# Patient Record
Sex: Female | Born: 2000 | Race: Black or African American | Hispanic: No | Marital: Single | Smoking: Never smoker
Health system: Southern US, Community
[De-identification: ages and names within clinical notes are randomized; demographics above are authoritative.]

## PROBLEM LIST (undated history)

## (undated) DIAGNOSIS — J45909 Unspecified asthma, uncomplicated: Secondary | ICD-10-CM

## (undated) HISTORY — PX: KNEE SURGERY: SHX244

---

## 2018-10-14 ENCOUNTER — Other Ambulatory Visit: Payer: Self-pay

## 2018-10-14 ENCOUNTER — Emergency Department (HOSPITAL_COMMUNITY)
Admission: EM | Admit: 2018-10-14 | Discharge: 2018-10-15 | Disposition: A | Discharge status: Home / Self Care | Payer: Federal, State, Local not specified - PPO | Attending: Emergency Medicine | Admitting: Emergency Medicine

## 2018-10-14 DIAGNOSIS — Y939 Activity, unspecified: Secondary | ICD-10-CM | POA: Diagnosis not present

## 2018-10-14 DIAGNOSIS — X102XXA Contact with fats and cooking oils, initial encounter: Secondary | ICD-10-CM | POA: Insufficient documentation

## 2018-10-14 DIAGNOSIS — T23261A Burn of second degree of back of right hand, initial encounter: Secondary | ICD-10-CM | POA: Insufficient documentation

## 2018-10-14 DIAGNOSIS — T22211A Burn of second degree of right forearm, initial encounter: Secondary | ICD-10-CM | POA: Diagnosis not present

## 2018-10-14 DIAGNOSIS — Y999 Unspecified external cause status: Secondary | ICD-10-CM | POA: Diagnosis not present

## 2018-10-14 DIAGNOSIS — Z8709 Personal history of other diseases of the respiratory system: Secondary | ICD-10-CM | POA: Diagnosis not present

## 2018-10-14 DIAGNOSIS — T300 Burn of unspecified body region, unspecified degree: Secondary | ICD-10-CM

## 2018-10-14 DIAGNOSIS — Y929 Unspecified place or not applicable: Secondary | ICD-10-CM | POA: Diagnosis not present

## 2018-10-14 HISTORY — DX: Unspecified asthma, uncomplicated: J45.909

## 2018-10-14 NOTE — ED Triage Notes (Signed)
Pt from home w/ a c/o a burn that she sustained from grease. She reports putting water on a hot, greased pan that splashed back on her. Minor, 1st degree burns with some blistering extends from her right hand to the medial side of her upper arm. The injury occurred at ~2145.

## 2018-10-15 ENCOUNTER — Encounter (HOSPITAL_COMMUNITY): Payer: Self-pay

## 2018-10-15 MED ORDER — SILVER SULFADIAZINE 1 % EX CREA
TOPICAL_CREAM | Freq: Once | CUTANEOUS | Status: AC
  Administered 2018-10-15: 1 via TOPICAL
  Filled 2018-10-15: qty 85

## 2018-10-15 NOTE — ED Notes (Signed)
Patient verbalizes understanding of discharge instructions. Opportunity for questioning and answers were provided. Armband removed by staff, pt discharged from ED.  

## 2018-10-15 NOTE — ED Provider Notes (Signed)
[facility]   CSN: [id number] Arrival date & time: 10/14/18  2347    History   Chief Complaint Chief Complaint  Patient presents with  . Burn    HPI Kimberly Ingram is a 18 y.o. female.     The history is provided by the patient and medical records.     18 y.o. F with hx of asthma, presenting to the ED for burn of right hand/arm.  States she poured some water into a pan with hot grease in it and it splashed back onto her.  States this actually occurred a few hours ago.  She did run cold water over it but skin still hurts and burns.  Tetanus UTD.    Past Medical History:  Diagnosis Date  . Asthma     There are no active problems to display for this patient.   History reviewed. No pertinent surgical history.   OB History   No obstetric history on file.      Home Medications    Prior to Admission medications   Not on File    Family History History reviewed. No pertinent family history.  Social History Social History   Tobacco Use  . Smoking status: Never Smoker  . Smokeless tobacco: Never Used  Substance Use Topics  . Alcohol use: Never    Frequency: Never  . Drug use: Not Currently    Types: Marijuana     Allergies   Patient has no allergy information on record.   Review of Systems Review of Systems  Skin: Positive for wound.  All other systems reviewed and are negative.    Physical Exam Updated Vital Signs BP (!) 99/54   Pulse 71   Temp 98 F (36.7 C) (Oral)   Ht 5\' 7"  (1.702 m)   Wt 68 kg   LMP 08/29/2018   SpO2 99%   BMI 23.49 kg/m   Physical Exam Vitals signs and nursing note reviewed.  Constitutional:      Appearance: She is well-developed.  HENT:     Head: Normocephalic and atraumatic.  Eyes:     Conjunctiva/sclera: Conjunctivae normal.     Pupils: Pupils are equal, round, and reactive to light.  Neck:     Musculoskeletal: Normal range of motion.  Cardiovascular:     Rate and Rhythm: Normal rate and regular rhythm.     Heart sounds: Normal heart sounds.  Pulmonary:     Effort: Pulmonary effort is normal.     Breath sounds: Normal breath sounds.  Abdominal:     General: Bowel sounds are normal.     Palpations: Abdomen is soft.  Musculoskeletal: Normal range of motion.     Comments: Right hand/arm with mixed areas of first and second degree burns; largest area overlying the right first MCP joint, small amount of blistering noted but able to flex/extend joint without issue; no palmar involvement; there are several other splotchy areas of first degree burns, 2 areas on forearm have small blisters noted; no drainage or signs of superimposed infection TBSA involved <2%  Skin:    General: Skin is warm and dry.  Neurological:     Mental Status: She is alert and oriented to person, place, and time.        ED Treatments / Results  Labs (all labs ordered are listed, but only abnormal results are displayed) Labs Reviewed - No data to display  EKG None  Radiology No results found.  Procedures  Procedures (including critical care time)  Medications Ordered in ED Medications - No data to display   Initial Impression / Assessment and Plan / ED Course  I have reviewed the triage vital signs and the nursing notes.  Pertinent labs & imaging results that were available during my care of the patient were reviewed by me and considered in my medical decision making (see chart for details).  18 y.o. F here with burn of right dorsal hand/forearm that occurred a few hours PTA after pouring water into pan with hot grease, states it splashed back on her.  She is afebrile, non-toxic.  She has splotchy appearing wounds to right dorsal hand/forearm, most of which are first degree but few small areas of blistering consistent with second degree.  Largest area does extend over left first MCP joint but no noted contractures.  No palmar involvement.  Able to flex/extend  all areas without difficulty.  Arm is NVI.  Tetanus UTD.  Given silvadene cream and dressing applied, continue home wounds care.  Can follow-up with PCP.  Return here for any new/acute changes.  Final Clinical Impressions(s) / ED Diagnoses   Final diagnoses:  Burn    ED Discharge Orders    None       Garlon Hatchet, PA-C 10/15/18 0043    Nira Conn, MD 10/16/18 [phone]

## 2018-10-15 NOTE — Discharge Instructions (Addendum)
Continue using silvadene to affected areas twice daily.  Recommend to keep covered if working, cooking, cleaning, etc. Keep clean with gentle soap and warm water.  Blistered areas may pop and drain some clear fluid, DO NOT pop them yourself or stick anything in them (needle, safety pin, etc)  as this will increase risk of infection. Follow-up with your primary care doctor. Return here for any new/acute changes.

## 2019-08-15 ENCOUNTER — Ambulatory Visit: Payer: Federal, State, Local not specified - PPO | Attending: Family

## 2019-08-15 DIAGNOSIS — Z23 Encounter for immunization: Secondary | ICD-10-CM

## 2019-08-15 NOTE — Progress Notes (Signed)
   Covid-19 Vaccination Clinic  Name:  Kandra Graven    MRN: [id number] DOB: August 30, 2000  08/15/2019  Ms. Schaafsma was observed post Covid-19 immunization for 15 minutes without incident. She was provided with Vaccine Information Sheet and instruction to access the V-Safe system.   Ms. Neuzil was instructed to call 911 with any severe reactions post vaccine: Marland Kitchen Difficulty breathing  . Swelling of face and throat  . A fast heartbeat  . A bad rash all over body  . Dizziness and weakness   Immunizations Administered    Name Date Dose VIS Date Route   Moderna COVID-19 Vaccine 08/15/2019 10:17 AM 0.5 mL 04/30/2019 Intramuscular   Manufacturer: Moderna   Lot: [id number]   NDC: 33832-919-16

## 2019-09-17 ENCOUNTER — Ambulatory Visit: Payer: Federal, State, Local not specified - PPO | Attending: Family

## 2019-09-17 ENCOUNTER — Ambulatory Visit: Payer: Federal, State, Local not specified - PPO

## 2019-09-17 DIAGNOSIS — Z23 Encounter for immunization: Secondary | ICD-10-CM

## 2019-09-17 NOTE — Progress Notes (Signed)
   Covid-19 Vaccination Clinic  Name:  [name]    MRN: [id number] DOB: 13-Apr-2001  09/17/2019  [name] was observed post Covid-19 immunization for 15 minutes without incident. She was provided with Vaccine Information Sheet and instruction to access the V-Safe system.   [name] was instructed to call 911 with any severe reactions post vaccine: Marland Kitchen Difficulty breathing  . Swelling of face and throat  . A fast heartbeat  . A bad rash all over body  . Dizziness and weakness   Immunizations Administered    Name Date Dose VIS Date Route   Moderna COVID-19 Vaccine 09/17/2019  2:40 PM 0.5 mL 04/2019 Intramuscular   Manufacturer: Moderna   Lot: [id number]   NDC: 25483-234-68

## 2020-03-27 ENCOUNTER — Emergency Department (HOSPITAL_COMMUNITY)
Admission: EM | Admit: 2020-03-27 | Discharge: 2020-03-28 | Disposition: A | Discharge status: Home / Self Care | Payer: Federal, State, Local not specified - PPO | Source: Home / Self Care | Attending: Emergency Medicine | Admitting: Emergency Medicine

## 2020-03-27 ENCOUNTER — Encounter (HOSPITAL_COMMUNITY): Payer: Self-pay | Admitting: Emergency Medicine

## 2020-03-27 ENCOUNTER — Emergency Department (HOSPITAL_COMMUNITY): Payer: Federal, State, Local not specified - PPO

## 2020-03-27 DIAGNOSIS — J45909 Unspecified asthma, uncomplicated: Secondary | ICD-10-CM | POA: Insufficient documentation

## 2020-03-27 DIAGNOSIS — F419 Anxiety disorder, unspecified: Secondary | ICD-10-CM | POA: Insufficient documentation

## 2020-03-27 DIAGNOSIS — F1290 Cannabis use, unspecified, uncomplicated: Secondary | ICD-10-CM | POA: Insufficient documentation

## 2020-03-27 DIAGNOSIS — R519 Headache, unspecified: Secondary | ICD-10-CM | POA: Insufficient documentation

## 2020-03-27 DIAGNOSIS — F332 Major depressive disorder, recurrent severe without psychotic features: Secondary | ICD-10-CM | POA: Insufficient documentation

## 2020-03-27 DIAGNOSIS — Z20822 Contact with and (suspected) exposure to covid-19: Secondary | ICD-10-CM | POA: Insufficient documentation

## 2020-03-27 DIAGNOSIS — R45851 Suicidal ideations: Secondary | ICD-10-CM | POA: Insufficient documentation

## 2020-03-27 DIAGNOSIS — H53149 Visual discomfort, unspecified: Secondary | ICD-10-CM | POA: Insufficient documentation

## 2020-03-27 LAB — RAPID URINE DRUG SCREEN, HOSP PERFORMED
Amphetamines: NOT DETECTED
Barbiturates: NOT DETECTED
Benzodiazepines: NOT DETECTED
Cocaine: NOT DETECTED
Opiates: NOT DETECTED
Tetrahydrocannabinol: POSITIVE — AB

## 2020-03-27 LAB — COMPREHENSIVE METABOLIC PANEL
ALT: 25 U/L (ref 0–44)
AST: 19 U/L (ref 15–41)
Albumin: 4.1 g/dL (ref 3.5–5.0)
Alkaline Phosphatase: 65 U/L (ref 38–126)
Anion gap: 9 (ref 5–15)
BUN: 8 mg/dL (ref 6–20)
CO2: 27 mmol/L (ref 22–32)
Calcium: 9 mg/dL (ref 8.9–10.3)
Chloride: 102 mmol/L (ref 98–111)
Creatinine, Ser: 0.85 mg/dL (ref 0.44–1.00)
GFR, Estimated: >60 mL/min (ref >60)
Glucose, Bld: 96 mg/dL (ref 70–99)
Potassium: 3.9 mmol/L (ref 3.5–5.1)
Sodium: 138 mmol/L (ref 135–145)
Total Bilirubin: 1 mg/dL (ref 0.3–1.2)
Total Protein: 8 g/dL (ref 6.5–8.1)

## 2020-03-27 LAB — CBC
HCT: 37.9 % (ref 36.0–46.0)
Hemoglobin: 12.4 g/dL (ref 12.0–15.0)
MCH: 30.3 pg (ref 26.0–34.0)
MCHC: 32.7 g/dL (ref 30.0–36.0)
MCV: 92.7 fL (ref 80.0–100.0)
Platelets: 250 10*3/uL (ref 150–400)
RBC: 4.09 MIL/uL (ref 3.87–5.11)
RDW: 13.3 % (ref 11.5–15.5)
WBC: 4.1 10*3/uL (ref 4.0–10.5)
nRBC: 0 % (ref 0.0–0.2)

## 2020-03-27 LAB — ACETAMINOPHEN LEVEL: Acetaminophen (Tylenol), Serum: <10 ug/mL — ABNORMAL LOW (ref 10–30)

## 2020-03-27 LAB — ETHANOL: Alcohol, Ethyl (B): <10 mg/dL (ref <10)

## 2020-03-27 LAB — SALICYLATE LEVEL: Salicylate Lvl: <7 mg/dL — ABNORMAL LOW (ref 7.0–30.0)

## 2020-03-27 LAB — RESPIRATORY PANEL BY RT PCR (FLU A&B, COVID)
Influenza A by PCR: NEGATIVE
Influenza B by PCR: NEGATIVE
SARS Coronavirus 2 by RT PCR: NEGATIVE

## 2020-03-27 LAB — I-STAT BETA HCG BLOOD, ED (MC, WL, AP ONLY): I-stat hCG, quantitative: <5 m[IU]/mL (ref <5)

## 2020-03-27 MED ORDER — RISPERIDONE 1 MG PO TBDP: 2.0000 mg | ORAL_TABLET | Freq: Three times a day (TID) | ORAL | Status: DC | PRN

## 2020-03-27 MED ORDER — LORAZEPAM 1 MG PO TABS: 1.0000 mg | ORAL_TABLET | ORAL | Status: DC | PRN

## 2020-03-27 MED ORDER — ZIPRASIDONE MESYLATE 20 MG IM SOLR: 20.0000 mg | INTRAMUSCULAR | Status: DC | PRN

## 2020-03-27 MED ORDER — ALUM & MAG HYDROXIDE-SIMETH 200-200-20 MG/5ML PO SUSP: 30.0000 mL | Freq: Four times a day (QID) | ORAL | Status: DC | PRN

## 2020-03-27 MED ORDER — ONDANSETRON HCL 4 MG PO TABS: 4.0000 mg | ORAL_TABLET | Freq: Three times a day (TID) | ORAL | Status: DC | PRN

## 2020-03-27 MED ORDER — ACETAMINOPHEN 325 MG PO TABS: 650.0000 mg | ORAL_TABLET | ORAL | Status: DC | PRN

## 2020-03-27 MED ORDER — ZOLPIDEM TARTRATE 5 MG PO TABS: 5.0000 mg | ORAL_TABLET | Freq: Every evening | ORAL | Status: DC | PRN

## 2020-03-27 MED ORDER — NICOTINE 21 MG/24HR TD PT24: 21.0000 mg | MEDICATED_PATCH | Freq: Every day | TRANSDERMAL | Status: DC

## 2020-03-27 NOTE — ED Notes (Signed)
Pt transported to [facility].  Belongings put in nurses station cabinet in the 9-12 and 23-25 section.

## 2020-03-27 NOTE — ED Provider Notes (Signed)
[facility]-EMERGENCY DEPT Provider Note   CSN: [id number] Arrival date & time: 03/27/20  1701     History Chief Complaint  Patient presents with  . Suicidal    Kimberly Ingram is a 19 y.o. female.  The history is provided by the patient and the EMS personnel. No language interpreter was used.     19 year old female brought here via EMS accompanied by [employer] for suicidal ideation.  Per EMS, patient's father found her passed out in her bedroom floor and patient cannot recall what has happened in the past few hours.  And there was a plan to IVC the patient.  Patient admits that she is feeling very depressed with suicidal ideation but did not disclose any specific plan.  She reports she cannot remember what has happened but does complain of throbbing headache and light sensitivity.  She denies homicidal ideation auditory or visual hallucination.  She admits to eating sleeping last.  She report occasional marijuana use but denies alcohol or tobacco use or any other drug use.  Last menstruation approximate 2 weeks ago.  Patient works at a bank.  She is currently in school.  She reported stress test due to multifactorial and everything stresses.  When asked if she may have intentionally took anything to harm herself patient states she does not remember  Past Medical History:  Diagnosis Date  . Asthma     There are no problems to display for this patient.   Past Surgical History:  Procedure Laterality Date  . KNEE SURGERY       OB History   No obstetric history on file.     History reviewed. No pertinent family history.  Social History   Tobacco Use  . Smoking status: Never Smoker  . Smokeless tobacco: Never Used  Vaping Use  . Vaping Use: Never used  Substance Use Topics  . Alcohol use: Never  . Drug use: Not Currently    Types: Marijuana    Home Medications Prior to Admission medications   Not on File    Allergies    Patient has no known  allergies.  Review of Systems   Review of Systems  Unable to perform ROS: Psychiatric disorder  All other systems reviewed and are negative.   Physical Exam Updated Vital Signs BP 125/79 (BP Location: Left Arm)   Pulse 63   Temp 98.9 F (37.2 C) (Oral)   Resp 14   Ht 5\' 6"  (1.676 m)   Wt 63.5 kg   SpO2 99%   BMI 22.60 kg/m   Physical Exam Vitals and nursing note reviewed.  Constitutional:      General: She is not in acute distress.    Appearance: She is well-developed.     Comments: Patient sitting in the chair, cover her head with a blanket and with her eyes closed.  HENT:     Head: Normocephalic and atraumatic.  Eyes:     Extraocular Movements: Extraocular movements intact.     Conjunctiva/sclera: Conjunctivae normal.     Pupils: Pupils are equal, round, and reactive to light.  Cardiovascular:     Rate and Rhythm: Normal rate and regular rhythm.     Pulses: Normal pulses.     Heart sounds: Normal heart sounds.  Pulmonary:     Effort: Pulmonary effort is normal.     Breath sounds: Normal breath sounds.  Abdominal:     Palpations: Abdomen is soft.     Tenderness: There is no abdominal  tenderness.  Musculoskeletal:     Cervical back: Normal range of motion and neck supple. No rigidity.  Skin:    Findings: No rash.  Neurological:     Mental Status: She is alert.     GCS: GCS eye subscore is 4. GCS verbal subscore is 5. GCS motor subscore is 6.     Sensory: Sensation is intact.     Motor: Motor function is intact.  Psychiatric:        Mood and Affect: Mood is depressed. Affect is blunt.        Speech: Speech is delayed.        Behavior: Behavior is withdrawn. Behavior is cooperative.        Thought Content: Thought content is not paranoid. Thought content includes suicidal ideation. Thought content does not include homicidal ideation.     ED Results / Procedures / Treatments   Labs (all labs ordered are listed, but only abnormal results are displayed) Labs  Reviewed  SALICYLATE LEVEL - Abnormal; Notable for the following components:      Result Value   Salicylate Lvl <7.0 (*)    All other components within normal limits  ACETAMINOPHEN LEVEL - Abnormal; Notable for the following components:   Acetaminophen (Tylenol), Serum <10 (*)    All other components within normal limits  RAPID URINE DRUG SCREEN, HOSP PERFORMED - Abnormal; Notable for the following components:   Tetrahydrocannabinol POSITIVE (*)    All other components within normal limits  RESPIRATORY PANEL BY RT PCR (FLU A&B, COVID)  COMPREHENSIVE METABOLIC PANEL  ETHANOL  CBC  I-STAT BETA HCG BLOOD, ED ([facility], [facility], [facility] ONLY)    EKG None  Radiology CT Head Wo Contrast  Result Date: 03/27/2020 CLINICAL DATA:  Headache, syncope. EXAM: CT HEAD WITHOUT CONTRAST TECHNIQUE: Contiguous axial images were obtained from the base of the skull through the vertex without intravenous contrast. COMPARISON:  None. FINDINGS: Brain: No acute infarct or intracranial hemorrhage. No mass lesion. No midline shift, ventriculomegaly or extra-axial fluid collection. Vascular: No hyperdense vessel or unexpected calcification. Skull: Negative for fracture or focal lesion. Sinuses/Orbits: Normal orbits. Clear paranasal sinuses. No mastoid effusion. Other: None. IMPRESSION: No acute intracranial process. Electronically Signed   By: Stana Bunting M.D.   On: 03/27/2020 18:17    Procedures Procedures (including critical care time)  Medications Ordered in ED Medications  risperiDONE (RISPERDAL M-TABS) disintegrating tablet 2 mg (has no administration in time range)    And  LORazepam (ATIVAN) tablet 1 mg (has no administration in time range)    And  ziprasidone (GEODON) injection 20 mg (has no administration in time range)  acetaminophen (TYLENOL) tablet 650 mg (has no administration in time range)  zolpidem (AMBIEN) tablet 5 mg (has no administration in time range)  ondansetron (ZOFRAN) tablet 4 mg (has no  administration in time range)  alum & mag hydroxide-simeth (MAALOX/MYLANTA) 200-200-20 MG/5ML suspension 30 mL (has no administration in time range)  nicotine (NICODERM CQ - dosed in mg/24 hours) patch 21 mg (has no administration in time range)    ED Course  I have reviewed the triage vital signs and the nursing notes.  Pertinent labs & imaging results that were available during my care of the patient were reviewed by me and considered in my medical decision making (see chart for details).    MDM Rules/Calculators/A&P  BP 125/79 (BP Location: Left Arm)   Pulse 63   Temp 98.9 F (37.2 C) (Oral)   Resp 14   Ht 5\' 6"  (1.676 m)   Wt 63.5 kg   SpO2 99%   BMI 22.60 kg/m   Final Clinical Impression(s) / ED Diagnoses Final diagnoses:  Suicidal ideation    Rx / DC Orders ED Discharge Orders    None     5:39 PM Patient brought here with concerns for suicidal attempt.  She has been feeling depressed for an unknown amount of time.  Unsure if patient may have intentionally overdosed on anything as she does not disclose much information.  She is currently hemodynamically stable.  She does endorse some headache and she was found on the ground therefore will obtain head CT scan.  Will filed IVC and first exam paper.  10:54 PM Pt is medically cleared and can be assessed and managed further by TTS and psychiatry.    , PA-C 03/27/20 2255    03/29/20, MD 03/27/20 2308

## 2020-03-27 NOTE — ED Triage Notes (Signed)
Patient presents with suicidal ideations. Her father found her passed out on her bedroom floor. She does not remember anything form the last 4 hours. There is a plan to IVC the patient. She also complains of a headache and is light sensitive. Patient takes Flexin and Albuterol at home.   EMS vitals: 122/70 BP 68 HR 98% SPO2 on room air

## 2020-03-27 NOTE — ED Notes (Addendum)
Pt changed into burgandy scrubs and belongings collected.  Belongings include sweatshirt, shorts and t shirt with tennis shoes. There is a biohazard bag with earrings and a gold cross necklace included. Belongings stored in triage nurse station cabinet.  Pt wanded by security.

## 2020-03-27 NOTE — ED Notes (Signed)
Patient believes she may have hit her head recently, but is not sure. Patient is also not forthcoming with information. Ie, When asked if she wanted to harm herself, her response was if I say yes will they keep me here. When the RN advised the patient to answer the questions truthfully, she admitted to having suicidal ideations.

## 2020-03-28 ENCOUNTER — Inpatient Hospital Stay (HOSPITAL_COMMUNITY)
Admission: AD | Admit: 2020-03-28 | Discharge: 2020-03-30 | DRG: 885 | Disposition: A | Discharge status: Home / Self Care | Payer: Federal, State, Local not specified - PPO | Source: Intra-hospital | Attending: Psychiatry | Admitting: Psychiatry

## 2020-03-28 ENCOUNTER — Encounter: Payer: Self-pay | Admitting: Behavioral Health

## 2020-03-28 ENCOUNTER — Encounter (HOSPITAL_COMMUNITY): Payer: Self-pay | Admitting: Psychiatry

## 2020-03-28 ENCOUNTER — Other Ambulatory Visit: Payer: Self-pay

## 2020-03-28 DIAGNOSIS — F410 Panic disorder [episodic paroxysmal anxiety] without agoraphobia: Secondary | ICD-10-CM | POA: Diagnosis present

## 2020-03-28 DIAGNOSIS — F419 Anxiety disorder, unspecified: Secondary | ICD-10-CM | POA: Diagnosis present

## 2020-03-28 DIAGNOSIS — F1220 Cannabis dependence, uncomplicated: Secondary | ICD-10-CM | POA: Diagnosis present

## 2020-03-28 DIAGNOSIS — R45851 Suicidal ideations: Secondary | ICD-10-CM | POA: Diagnosis present

## 2020-03-28 DIAGNOSIS — Z91010 Allergy to peanuts: Secondary | ICD-10-CM

## 2020-03-28 DIAGNOSIS — F332 Major depressive disorder, recurrent severe without psychotic features: Principal | ICD-10-CM | POA: Diagnosis present

## 2020-03-28 DIAGNOSIS — J45909 Unspecified asthma, uncomplicated: Secondary | ICD-10-CM | POA: Diagnosis present

## 2020-03-28 DIAGNOSIS — F1210 Cannabis abuse, uncomplicated: Secondary | ICD-10-CM | POA: Diagnosis not present

## 2020-03-28 MED ORDER — ALUM & MAG HYDROXIDE-SIMETH 200-200-20 MG/5ML PO SUSP: 30.0000 mL | ORAL | Status: DC | PRN

## 2020-03-28 MED ORDER — ACETAMINOPHEN 325 MG PO TABS: 650.0000 mg | ORAL_TABLET | Freq: Four times a day (QID) | ORAL | Status: DC | PRN

## 2020-03-28 MED ORDER — MAGNESIUM HYDROXIDE 400 MG/5ML PO SUSP: 30.0000 mL | Freq: Every day | ORAL | Status: DC | PRN

## 2020-03-28 NOTE — Progress Notes (Signed)
   03/28/20 2030  Psych Admission Type (Psych Patients Only)  Admission Status Involuntary  Psychosocial Assessment  Patient Complaints None  Eye Contact Fair  Facial Expression Other (Comment) (appropriate)  Affect Appropriate to circumstance  Speech Logical/coherent  Interaction Assertive  Motor Activity Other (Comment) (wnl)  Appearance/Hygiene Unremarkable  Behavior Characteristics Appropriate to situation  Mood Pleasant  Thought Process  Coherency WDL  Content WDL  Delusions None reported or observed  Perception WDL  Hallucination None reported or observed  Judgment Poor  Confusion None  Danger to Self  Current suicidal ideation? Denies  Danger to Others  Danger to Others None reported or observed   Pt seen interacting in dayroom. Pt denies SI, HI, AVH and pain. Pt is pleasant. Hopes to be discharged tomorrow.

## 2020-03-28 NOTE — [facility] Suicide Risk Assessment (Addendum)
[facility] INPATIENT:  Family/Significant Other Suicide Prevention Education  Suicide Prevention Education:  Contact Attempts: friend, Joselyn Glassman [phone] has been identified by the patient as the family member/significant other with whom the patient will be residing, and identified as the person(s) who will aid the patient in the event of a mental health crisis.  With written consent from the patient, two attempts were made to provide suicide prevention education, prior to and/or following the patient's discharge.  We were unsuccessful in providing suicide prevention education.  A suicide education pamphlet was given to the patient to share with family/significant other.  Date and time of first attempt: 03/28/2020 at 4:35pm Date and time of second attempt: 03/29/2020 at 2:52pm   SPE pamphlet placed on chart for patient to share with supports at discharge.  Darreld Mclean 03/28/2020, 4:37 PM   Fredirick Lathe, LCSWA Clinicial Social Worker [facility]

## 2020-03-28 NOTE — [facility] Group Notes (Signed)
Psychoeducational Group Note  Date: 03-28-20 Time:  8099-8338  Group Topic/Focus:  Identifying Needs:   The focus of this group is to help patients identify their personal needs that have been historically problematic and identify healthy behaviors to address their needs.  Participation Level:  Did Not Attend   Dione Housekeeper

## 2020-03-28 NOTE — Progress Notes (Signed)
[place] NOVEL CORONAVIRUS (COVID-19) DAILY CHECK-OFF SYMPTOMS - answer yes or no to each - every day NO YES  Have you had a fever in the past 24 hours?  . Fever (Temp > 37.80C / 100F) X   Have you had any of these symptoms in the past 24 hours? . New Cough .  Sore Throat  .  Shortness of Breath .  Difficulty Breathing .  Unexplained Body Aches   X   Have you had any one of these symptoms in the past 24 hours not related to allergies?   . Runny Nose .  Nasal Congestion .  Sneezing   X   If you have had runny nose, nasal congestion, sneezing in the past 24 hours, has it worsened?  X   EXPOSURES - check yes or no X   Have you traveled outside the state in the past 14 days?  X   Have you been in contact with someone with a confirmed diagnosis of COVID-19 or PUI in the past 14 days without wearing appropriate PPE?  X   Have you been living in the same home as a person with confirmed diagnosis of COVID-19 or a PUI (household contact)?    X   Have you been diagnosed with COVID-19?    X              What to do next: Answered NO to all: Answered YES to anything:   Proceed with unit schedule Follow the [facility] Inpatient Flowsheet.   Pt visible at medication window on initial approach. Presents superficial on interactions with congruent affect, logical, clear speech, fair eye contact and ambulatory with a steady gait. Rates her depression 0/10 and anxiety 5/10 when assessed. Emotional support and availability provided to pt this shift. Writer encouraged pt to voice concerns.  Pt receptive to care. Cooperative with unit routines.  Observed in scheduled groups. Tolerates all PO intake well. Safety maintained on and off unit.

## 2020-03-28 NOTE — [facility] Suicide Risk Assessment (Signed)
[facility] INPATIENT:  Family/Significant Other Suicide Prevention Education  Suicide Prevention Education:  Patient unable to provide consent for Family/Significant Other Suicide Prevention Education: The patient has been unable to provide written consent for family/significant other to be provided Family/Significant Other Suicide Prevention Education during admission and/or prior to discharge. Physician notified.  SPE completed with patient, as patient has been unable to provide consent for family contact. SPE pamphlet placed on chart for patient to share with supports at discharge.  Arnold Depinto, LCSWA Clinicial Social Worker [facility] 

## 2020-03-28 NOTE — H&P (Signed)
Psychiatric Admission Assessment Adult  Patient Identification: Kimberly Ingram MRN:  [id number] Date of Evaluation:  03/28/2020 Chief Complaint:  MDD (major depressive disorder), recurrent episode, severe (HCC) [F33.2] Principal Diagnosis: MDD (major depressive disorder), recurrent episode, severe (HCC) Diagnosis:  Principal Problem:   MDD (major depressive disorder), recurrent episode, severe (HCC) Active Problems:   Marijuana abuse   Anxious mood  History of Present Illness:   Per ED provider assessment: 19 yr old female brought here via EMS accompanied by [employer] for suicidal ideation.  Per EMS, patient's father found her passed out in her bedroom floor and patient cannot recall what has happened in the past few hours.  And there was a plan to IVC the patient.  Patient admits that she is feeling very depressed with suicidal ideation but did not disclose any specific plan.  Per TTS Assessment note:  Pt reports that her parents are in [place], which is where she is from.  Patient says she cannot recall who found her passed out and that she cannot recall even the EMS ride to [facility]. Patient says that right now she is not feeling suicidal.  Earlier yesterday she did feel suicidal but with no plan or intention.  She denies any previous attempts to end her life.  Patient says she has been very depressed and anxious over the last few weeks.  She says that her grades are good. She lives with two roommates in an apartment off campus at [employer].  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  Patient denies any HI or A/V hallucinations.  Patient was positive for THC.  She said she rarely uses marijuana, maybe once in a month.  She said that she last consumed THC in a edible a few days ago.   Patient has a anxious demeanor.  She also has a depressed affect too.  Eye contact is fair and patient is oriented x4.  Patient is not responding to internal stimuli.  Patient is not engaged  in delusional thought process.  Patient thought process is logical and coherent.  Patient reports getting less than 4H/D of sleep for the last few weeks. She has had a poor appetite and has lost 10 pounds over the last few weeks.  Intake Assessment:  Kimberly Ingram, 19 y.o., female patient seen face to face by this provider, consulted with Dr. Viviano Simas; treatment team and chart reviewed on 03/28/20.  On evaluation Kimberly Ingram reports she has not psychiatric history and that she did not try to kill herself.  "I think I just had a panic attack and fell out.  I wasn't trying to kill myself or anything like that.  I guess this all stems from me sending a text message to my mom telling her that sometimes I feel like I just don't want to be here.  I wasn't telling her that I wanted to die or that I was going to kill myself; it was just how I was feeling at the time."  Patient denies prior psychiatric history, hospitalization, and psychotropic medications.  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  States that she lives with 2 other roommates.  States that her family lives in [place] and she speaks to them regularly.  Patient states that she is not interested in taking any medications for anxiety "Yesterday was just an odd day for me; I usually don't experience anything like that."  Patient asked about positive  THC on UDS "I was really surprised that, that showed up on there.  About 3 days ago I ate a candy bar that was given to me by a friend that was a Delta 8 or 10 CBD.  I didn't think it showed up like that.  No I don't do drugs and I may drink alcohol occasionally.  Patient states that she will sign consent to speak to her roommates, mother, "anyone that can collaborate my story that I am not a danger to myself and that I don't want to kill myself." During evaluation Kimberly Ingram is in the day room interacting with peers.  She is alert/oriented x 4; calm/cooperative; and mood is congruent with  affect.  She does not appear to be responding to internal/external stimuli or delusional thoughts.  Patient denies suicidal/self-harm/homicidal ideation, psychosis, and paranoia.  Patient answered question appropriately.     Associated Signs/Symptoms: Depression Symptoms:  anxiety, Duration of Depression Symptoms: No data recorded (Hypo) Manic Symptoms:  Denies Anxiety Symptoms:  Denies Psychotic Symptoms:  Denies Duration of Psychotic Symptoms: No data recorded PTSD Symptoms: Denies Total Time spent with patient: 45 minutes  Past Psychiatric History: Denies prior psychiatric history  Is the patient at risk to self? No.  Has the patient been a risk to self in the past 6 months? No.  Has the patient been a risk to self within the distant past? No.  Is the patient a risk to others? No.  Has the patient been a risk to others in the past 6 months? No.  Has the patient been a risk to others within the distant past? No.   Prior Inpatient Therapy:  No Prior Outpatient Therapy:   No  Alcohol Screening: 1. How often do you have a drink containing alcohol?: 2 to 3 times a week 2. How many drinks containing alcohol do you have on a typical day when you are drinking?: 3 or 4 3. How often do you have six or more drinks on one occasion?: Less than monthly AUDIT-C Score: 5 4. How often during the last year have you found that you were not able to stop drinking once you had started?: Never 5. How often during the last year have you failed to do what was normally expected from you because of drinking?: Never 6. How often during the last year have you needed a first drink in the morning to get yourself going after a heavy drinking session?: Never 7. How often during the last year have you had a feeling of guilt of remorse after drinking?: Never 8. How often during the last year have you been unable to remember what happened the night before because you had been drinking?: Never 9. Have you or  someone else been injured as a result of your drinking?: No 10. Has a relative or friend or a doctor or another health worker been concerned about your drinking or suggested you cut down?: No Alcohol Use Disorder Identification Test Final Score (AUDIT): 5 Alcohol Brief Interventions/Follow-up: AUDIT Score <7 follow-up not indicated Substance Abuse History in the last 12 months:  No. Consequences of Substance Abuse: Denies Previous Psychotropic Medications: No  Psychological Evaluations: No  Past Medical History:  Past Medical History:  Diagnosis Date  . Asthma     Past Surgical History:  Procedure Laterality Date  . KNEE SURGERY     Family History: History reviewed. No pertinent family history. Family Psychiatric  History: Denies Tobacco Screening: Have you used any form of  tobacco in the last 30 days? (Cigarettes, Smokeless Tobacco, Cigars, and/or Pipes): No Social History:  Social History   Substance and Sexual Activity  Alcohol Use Yes  . Alcohol/week: 5.0 standard drinks  . Types: 5 Shots of liquor per week   Comment: at a party will do 5 shots     Social History   Substance and Sexual Activity  Drug Use Not Currently  . Types: Marijuana    Additional Social History: Marital status: Single Are you sexually active?: Yes What is your sexual orientation?: "gay" Has your sexual activity been affected by drugs, alcohol, medication, or emotional stress?: none reported Does patient have children?: No  Allergies:   Allergies  Allergen Reactions  . Peanut-Containing Drug Products Swelling   Lab Results:  Results for orders placed or performed during the hospital encounter of 03/27/20 (from the past 48 hour(s))  Rapid urine drug screen (hospital performed)     Status: Abnormal   Collection Time: 03/27/20  5:17 PM  Result Value Ref Range   Opiates NONE DETECTED NONE DETECTED   Cocaine NONE DETECTED NONE DETECTED   Benzodiazepines NONE DETECTED NONE DETECTED    Amphetamines NONE DETECTED NONE DETECTED   Tetrahydrocannabinol POSITIVE (A) NONE DETECTED   Barbiturates NONE DETECTED NONE DETECTED    Comment: (NOTE) DRUG SCREEN FOR MEDICAL PURPOSES ONLY.  IF CONFIRMATION IS NEEDED FOR ANY PURPOSE, NOTIFY LAB WITHIN 5 DAYS.  LOWEST DETECTABLE LIMITS FOR URINE DRUG SCREEN Drug Class                     Cutoff (ng/mL) Amphetamine and metabolites    1000 Barbiturate and metabolites    200 Benzodiazepine                 200 Tricyclics and metabolites     300 Opiates and metabolites        300 Cocaine and metabolites        300 THC                            50 Performed at [facility], [address] [place], [place] [address]   Comprehensive metabolic panel     Status: None   Collection Time: 03/27/20  5:45 PM  Result Value Ref Range   Sodium 138 135 - 145 mmol/L   Potassium 3.9 3.5 - 5.1 mmol/L   Chloride 102 98 - 111 mmol/L   CO2 27 22 - 32 mmol/L   Glucose, Bld 96 70 - 99 mg/dL    Comment: Glucose reference range applies only to samples taken after fasting for at least 8 hours.   BUN 8 6 - 20 mg/dL   Creatinine, Ser 7.16 0.44 - 1.00 mg/dL   Calcium 9.0 8.9 - 96.7 mg/dL   Total Protein 8.0 6.5 - 8.1 g/dL   Albumin 4.1 3.5 - 5.0 g/dL   AST 19 15 - 41 U/L   ALT 25 0 - 44 U/L   Alkaline Phosphatase 65 38 - 126 U/L   Total Bilirubin 1.0 0.3 - 1.2 mg/dL   GFR, Estimated >89 >38 mL/min    Comment: (NOTE) Calculated using the CKD-EPI Creatinine Equation (2021)    Anion gap 9 5 - 15    Comment: Performed at [facility], [address], [place], [place] [address]  Ethanol     Status: None   Collection Time: 03/27/20  5:45 PM  Result Value Ref Range   Alcohol, Ethyl (B) <10 <10 mg/dL    Comment: (NOTE) Lowest detectable limit for serum alcohol is 10 mg/dL.  For medical purposes only. Performed at [facility], [address], [place], [place] [address]   Salicylate level      Status: Abnormal   Collection Time: 03/27/20  5:45 PM  Result Value Ref Range   Salicylate Lvl <7.0 (L) 7.0 - 30.0 mg/dL    Comment: Performed at [facility], [address], [place], [place] [address]  Acetaminophen level     Status: Abnormal   Collection Time: 03/27/20  5:45 PM  Result Value Ref Range   Acetaminophen (Tylenol), Serum <10 (L) 10 - 30 ug/mL    Comment: (NOTE) Therapeutic concentrations vary significantly. A range of 10-30 ug/mL  may be an effective concentration for many patients. However, some  are best treated at concentrations outside of this range. Acetaminophen concentrations >150 ug/mL at 4 hours after ingestion  and >50 ug/mL at 12 hours after ingestion are often associated with  toxic reactions.  Performed at [facility], [address] [place], [place] [address]   cbc     Status: None   Collection Time: 03/27/20  5:45 PM  Result Value Ref Range   WBC 4.1 4.0 - 10.5 K/uL   RBC 4.09 3.87 - 5.11 MIL/uL   Hemoglobin 12.4 12.0 - 15.0 g/dL   HCT 91.4 36 - 46 %   MCV 92.7 80.0 - 100.0 fL   MCH 30.3 26.0 - 34.0 pg   MCHC 32.7 30.0 - 36.0 g/dL   RDW 78.2 95.6 - 21.3 %   Platelets 250 150 - 400 K/uL   nRBC 0.0 0.0 - 0.2 %    Comment: Performed at [facility], [address] [place], [place] [address]  I-Stat beta hCG blood, ED     Status: None   Collection Time: 03/27/20  5:50 PM  Result Value Ref Range   I-stat hCG, quantitative <5.0 <5 mIU/mL   Comment 3            Comment:   GEST. AGE      CONC.  (mIU/mL)   <=1 WEEK        5 - 50     2 WEEKS       50 - 500     3 WEEKS       100 - 10,000     4 WEEKS     1,000 - 30,000        FEMALE AND NON-PREGNANT FEMALE:     LESS THAN 5 mIU/mL   Respiratory Panel by RT PCR (Flu A&B, Covid) - Nasopharyngeal Swab     Status: None   Collection Time: 03/27/20  5:52 PM   Specimen: Nasopharyngeal Swab  Result Value Ref Range   SARS Coronavirus 2 by RT PCR  NEGATIVE NEGATIVE    Comment: (NOTE) SARS-CoV-2 target nucleic acids are NOT DETECTED.  The SARS-CoV-2 RNA is generally detectable in upper respiratoy specimens during the acute phase of infection. The lowest concentration of SARS-CoV-2 viral copies this assay can detect is 131 copies/mL. A negative result does not preclude SARS-Cov-2 infection and should not be used as the sole basis for treatment or other patient management decisions. A negative result may occur with  improper specimen collection/handling, submission of specimen other than nasopharyngeal swab, presence of viral mutation(s) within the areas targeted by this  assay, and inadequate number of viral copies (<131 copies/mL). A negative result must be combined with clinical observations, patient history, and epidemiological information. The expected result is Negative.  Fact Sheet for Patients:  https://www.moore.com/https://www.fda.gov/media/142436/download  Fact Sheet for Healthcare Providers:  https://www.young.biz/https://www.fda.gov/media/142435/download  This test is no t yet approved or cleared by the [place] FDA and  has been authorized for detection and/or diagnosis of SARS-CoV-2 by FDA under an Emergency Use Authorization (EUA). This EUA will remain  in effect (meaning this test can be used) for the duration of the COVID-19 declaration under Section 564(b)(1) of the Act, 21 U.S.C. section 360bbb-3(b)(1), unless the authorization is terminated or revoked sooner.     Influenza A by PCR NEGATIVE NEGATIVE   Influenza B by PCR NEGATIVE NEGATIVE    Comment: (NOTE) The Xpert Xpress SARS-CoV-2/FLU/RSV assay is intended as an aid in  the diagnosis of influenza from Nasopharyngeal swab specimens and  should not be used as a sole basis for treatment. Nasal washings and  aspirates are unacceptable for Xpert Xpress SARS-CoV-2/FLU/RSV  testing.  Fact Sheet for Patients: https://www.moore.com/https://www.fda.gov/media/142436/download  Fact Sheet for Healthcare  Providers: https://www.young.biz/https://www.fda.gov/media/142435/download  This test is not yet approved or cleared by the [place] FDA and  has been authorized for detection and/or diagnosis of SARS-CoV-2 by  FDA under an Emergency Use Authorization (EUA). This EUA will remain  in effect (meaning this test can be used) for the duration of the  Covid-19 declaration under Section 564(b)(1) of the Act, 21  U.S.C. section 360bbb-3(b)(1), unless the authorization is  terminated or revoked. Performed at [facility], [address] [place], [place] [address]     Blood Alcohol level:  Lab Results  Component Value Date   ETH <10 03/27/2020    Metabolic Disorder Labs:  No results found for: HGBA1C, MPG No results found for: PROLACTIN No results found for: CHOL, TRIG, HDL, CHOLHDL, VLDL, LDLCALC  Current Medications: Current Facility-Administered Medications  Medication Dose Route Frequency Provider Last Rate Last Admin  . acetaminophen (TYLENOL) tablet 650 mg  650 mg Oral Q6H PRN Gillermo Murdochhompson, Jacqueline, NP      . alum & mag hydroxide-simeth (MAALOX/MYLANTA) 200-200-20 MG/5ML suspension 30 mL  30 mL Oral Q4H PRN Gillermo Murdochhompson, Jacqueline, NP      . magnesium hydroxide (MILK OF MAGNESIA) suspension 30 mL  30 mL Oral Daily PRN Gillermo Murdochhompson, Jacqueline, NP       PTA Medications: Medications Prior to Admission  Medication Sig Dispense Refill Last Dose  . albuterol (VENTOLIN HFA) 108 (90 Base) MCG/ACT inhaler Inhale 1 puff into the lungs every 6 (six) hours as needed for wheezing or shortness of breath.      . fluticasone (FLOVENT HFA) 44 MCG/ACT inhaler Inhale 2 puffs into the lungs every 6 (six) hours as needed (For shortness of breath).        Musculoskeletal: Strength & Muscle Tone: within normal limits Gait & Station: normal Patient leans: N/A  Psychiatric Specialty Exam: Physical Exam  Review of Systems  Blood pressure 125/74, pulse (!) 56, temperature 97.6 F (36.4 C), temperature  source Oral, resp. rate 16, height 5\' 6"  (1.676 m), weight 65.3 kg, SpO2 99 %.Body mass index is 23.24 kg/m.  General Appearance: Casual  Eye Contact:  Good  Speech:  Clear and Coherent and Normal Rate  Volume:  Normal  Mood:  Anxious  Affect:  Appropriate and Congruent  Thought Process:  Coherent, Goal Directed and Descriptions of Associations: Intact  Orientation:  Full (Time, Place, and Person)  Thought Content:  WDL and Logical  Suicidal Thoughts:  No  Homicidal Thoughts:  No  Memory:  Immediate;   Good Recent;   Good  Judgement:  Intact  Insight:  Present  Psychomotor Activity:  Normal  Concentration:  Concentration: Good  Recall:  Good  Fund of Knowledge:  Good  Language:  Good  Akathisia:  No  Handed:  Right  AIMS (if indicated):     Assets:  Communication Skills Desire for Improvement Housing Physical Health Social Support  ADL's:  Intact  Cognition:  WNL  Sleep:       Treatment Plan Summary: Daily contact with patient to assess and evaluate symptoms and progress in treatment  Observation Level/Precautions:  15 minute checks  Laboratory:  CBC Chemistry Profile HCG UDS  Psychotherapy:  Individual and group sessions  Medications:  Patient does not wish to start any psychotropic medications.    Consultations:  As appropriate  Discharge Concerns:  Safety, stabilization, and access to medication  Estimated LOS:  5-7 days  Other:     Physician Treatment Plan for Primary Diagnosis: MDD (major depressive disorder), recurrent episode, severe (HCC) Long Term Goal(s): Improvement in symptoms so as ready for discharge  Short Term Goals: Ability to identify changes in lifestyle to reduce recurrence of condition will improve, Ability to verbalize feelings will improve, Ability to disclose and discuss suicidal ideas, Ability to demonstrate self-control will improve, Ability to identify and develop effective coping behaviors will improve, Ability to maintain clinical  measurements within normal limits will improve, Compliance with prescribed medications will improve and Ability to identify triggers associated with substance abuse/mental health issues will improve  Physician Treatment Plan for Secondary Diagnosis: Principal Problem:   MDD (major depressive disorder), recurrent episode, severe (HCC) Active Problems:   Marijuana abuse   Anxious mood  Long Term Goal(s): Improvement in symptoms so as ready for discharge  Short Term Goals: Ability to identify changes in lifestyle to reduce recurrence of condition will improve, Ability to verbalize feelings will improve, Ability to disclose and discuss suicidal ideas, Ability to demonstrate self-control will improve, Ability to identify and develop effective coping behaviors will improve, Ability to maintain clinical measurements within normal limits will improve, Compliance with prescribed medications will improve and Ability to identify triggers associated with substance abuse/mental health issues will improve  I certify that inpatient services furnished can reasonably be expected to improve the patient's condition.    Deb Loudin, NP 10/30/20211:49 PM

## 2020-03-28 NOTE — BHH Suicide Risk Assessment (Signed)
[facility] Admission Suicide Risk Assessment   Nursing information obtained from:  Patient Demographic factors:  Kimberly Ingram, lesbian, or bisexual orientation, Adolescent or young adult Current Mental Status:  NA Loss Factors:  NA Historical Factors:  NA Risk Reduction Factors:  Employed, Positive social support  Total Time spent with patient: 20 minutes Principal Problem: MDD (major depressive disorder), recurrent episode, severe (HCC) Diagnosis:  Principal Problem:   MDD (major depressive disorder), recurrent episode, severe (HCC) Active Problems:   Marijuana abuse   Anxious mood  Subjective Data: "I do not really know what happened, but I was not trying to kill myself.  I think I just had a panic attack." Patient is a senior at a [employer].  She describes being in her room and having a panic attack and blacking out.  She denies any drug use, alcohol use or medication use.  She has never seen a psychiatrist or had counseling.  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  She is denying any SI, HI, AVH.  She is able to contract for safety while in the hospital.  History of Present Illness:   Per ED provider assessment: 19 yr old female brought here via EMS accompanied by [employer] for suicidal ideation. Per EMS, patient's father found her passed out in her bedroom floor and patient cannot recall what has happened in the past few hours. And there was a plan to IVC the patient. Patient admits that she is feeling very depressed with suicidal ideation but did not disclose any specific plan. Per TTS Assessment note:  Pt reports that her parents are in [place], which is where she is from. Patient says she cannot recall who found her passed out and that she cannot recall even the EMS ride to [facility]. Patient says that right now she is not feeling suicidal. Earlier yesterday she did feel suicidal but with no plan or intention. She denies any previous attempts to end her  life. Patient says she has been very depressed and anxious over the last few weeks. She says that her grades are good. She lives with two roommates in an apartment off campus at [employer]. 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  Patient denies any HI or A/V hallucinations. Patient was positive for THC. She said she rarely uses marijuana, maybe once in a month. She said that she last consumed THC in a edible a few days ago.  Patient has a anxious demeanor. She also has a depressed affect too. Eye contact is fair and patient is oriented x4. Patient is not responding to internal stimuli. Patient is not engaged in delusional thought process. Patient thought process is logical and coherent. Patient reports getting less than 4H/D of sleep for the last few weeks. She has had a poor appetite and has lost 10 pounds over the last few weeks.  Intake Assessment:  Kimberly Ingram, 19 y.o., female patient seen face to face by this provider, consulted with Dr. Viviano Simas; treatment team and chart reviewed on 03/28/20.  On evaluation Kimberly Ingram reports she has not psychiatric history and that she did not try to kill herself.  "I think I just had a panic attack and fell out.  I wasn't trying to kill myself or anything like that.  I guess this all stems from me sending a text message to my mom telling her that sometimes I feel like I just don't want  to be here.  I wasn't telling her that I wanted to die or that I was going to kill myself; it was just how I was feeling at the time."  Patient denies prior psychiatric history, hospitalization, and psychotropic medications.  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  States that she lives with 2 other roommates.  States that her family lives in [place] and she speaks to them regularly.  Patient states that she is not interested in taking any medications for anxiety "Yesterday was just an odd day for me; I usually don't  experience anything like that."  Patient asked about positive THC on UDS "I was really surprised that, that showed up on there.  About 3 days ago I ate a candy bar that was given to me by a friend that was a Delta 8 or 10 CBD.  I didn't think it showed up like that.  No I don't do drugs and I may drink alcohol occasionally.  Patient states that she will sign consent to speak to her roommates, mother, "anyone that can collaborate my story that I am not a danger to myself and that I don't want to kill myself." During evaluation Madina Galati is in the day room interacting with peers.  She is alert/oriented x 4; calm/cooperative; and mood is congruent with affect.  She does not appear to be responding to internal/external stimuli or delusional thoughts.  Patient denies suicidal/self-harm/homicidal ideation, psychosis, and paranoia.  Patient answered question appropriately.     Continued Clinical Symptoms:  Alcohol Use Disorder Identification Test Final Score (AUDIT): 5 The "Alcohol Use Disorders Identification Test", Guidelines for Use in Primary Care, Second Edition.  World Science writer [facility]). Score between 0-7:  no or low risk or alcohol related problems. Score between 8-15:  moderate risk of alcohol related problems. Score between 16-19:  high risk of alcohol related problems. Score 20 or above:  warrants further diagnostic evaluation for alcohol dependence and treatment.   CLINICAL FACTORS:   Severe Anxiety and/or Agitation Dysthymia   Musculoskeletal: Strength & Muscle Tone: within normal limits Gait & Station: normal Patient leans: N/A  Psychiatric Specialty Exam: Physical Exam  Review of Systems  Blood pressure 125/74, pulse (!) 56, temperature 97.6 F (36.4 C), temperature source Oral, resp. rate 16, height 5\' 6"  (1.676 m), weight 65.3 kg, SpO2 99 %.Body mass index is 23.24 kg/m.  General Appearance: Casual  Eye Contact:  Good  Speech:  Clear and Coherent and Normal Rate   Volume:  Normal  Mood:  Anxious  Affect:  Appropriate and Congruent  Thought Process:  Coherent, Goal Directed and Descriptions of Associations: Intact  Orientation:  Full (Time, Place, and Person)  Thought Content:  WDL and Logical  Suicidal Thoughts:  No  Homicidal Thoughts:  No  Memory:  Immediate;   Good Recent;   Good  Judgement:  Intact  Insight:  Present  Psychomotor Activity:  Normal  Concentration:  Concentration: Good  Recall:  Good  Fund of Knowledge:  Good  Language:  Good  Akathisia:  No  Handed:  Right  AIMS (if indicated):     Assets:  Communication Skills Desire for Improvement Housing Physical Health Social Support  ADL's:  Intact  Cognition:  WNL  Sleep:        COGNITIVE FEATURES THAT CONTRIBUTE TO RISK:  Thought constriction (tunnel vision)    SUICIDE RISK:   Minimal: No identifiable suicidal ideation.  Patients presenting with  no risk factors but with morbid ruminations; may be classified as minimal risk based on the severity of the depressive symptoms  PLAN OF CARE:  Treatment Plan Summary: Daily contact with patient to assess and evaluate symptoms and progress in treatment  Observation Level/Precautions:  15 minute checks  Laboratory:  CBC Chemistry Profile HCG UDS  Psychotherapy:  Individual and group sessions  Medications:  Patient does not wish to start any psychotropic medications.    Consultations:  As appropriate  Discharge Concerns:  Safety, stabilization, and access to medication  Estimated LOS:  5-7 days  Other:     Physician Treatment Plan for Primary Diagnosis: MDD (major depressive disorder), recurrent episode, severe (HCC) Long Term Goal(s): Improvement in symptoms so as ready for discharge  Short Term Goals: Ability to identify changes in lifestyle to reduce recurrence of condition will improve, Ability to verbalize feelings will improve, Ability to disclose and discuss suicidal ideas, Ability to demonstrate  self-control will improve, Ability to identify and develop effective coping behaviors will improve, Ability to maintain clinical measurements within normal limits will improve, Compliance with prescribed medications will improve and Ability to identify triggers associated with substance abuse/mental health issues will improve  Physician Treatment Plan for Secondary Diagnosis: Principal Problem:   MDD (major depressive disorder), recurrent episode, severe (HCC) Active Problems:   Marijuana abuse   Anxious mood  Long Term Goal(s): Improvement in symptoms so as ready for discharge  Short Term Goals: Ability to identify changes in lifestyle to reduce recurrence of condition will improve, Ability to verbalize feelings will improve, Ability to disclose and discuss suicidal ideas, Ability to demonstrate self-control will improve, Ability to identify and develop effective coping behaviors will improve, Ability to maintain clinical measurements within normal limits will improve, Compliance with prescribed medications will improve and Ability to identify triggers associated with substance abuse/mental health issues will improve     I certify that inpatient services furnished can reasonably be expected to improve the patient's condition.   Mariel Craft, MD 03/28/2020, 9:06 PM

## 2020-03-28 NOTE — ED Notes (Signed)
[facility] called to take pt to [facility]

## 2020-03-28 NOTE — BH Assessment (Signed)
Tele Assessment Note   Kimberly Ingram Name: Kimberly Ingram MRN: [id number] Referring Physician: Fayrene Helper, PA Location of Kimberly Ingram: [facility] Location of Provider: Behavioral Health TTS Department  Kimberly Ingram is an 19 y.o. female.  -Clinician reviewed note by Fayrene Helper, PA.  19 year old female brought here via EMS accompanied by [facility] for suicidal ideation.  Per EMS, Kimberly Ingram's father found her passed out in her bedroom floor and Kimberly Ingram cannot recall what has happened in the past few hours.  And there was a plan to IVC the Kimberly Ingram.  Kimberly Ingram admits that she is feeling very depressed with suicidal ideation but did not disclose any specific plan.   Pt reports that her parents are in [place], which is where she is from.  Kimberly Ingram says she cannot recall who found her passed out and that she cannot recall even the EMS ride to [facility].  Kimberly Ingram says that right now she is not feeling suicidal.  Earlier yesterday she did feel suicidal but with no plan or intention.  She denies any previous attempts to end her life.  Kimberly Ingram says she has been very depressed and anxious over the last few weeks.  She says that her grades are good. She lives with two roommates in an apartment off campus at [employer].  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   Kimberly Ingram denies any HI or A/V hallucinations.  Kimberly Ingram was positive for THC.  She said she rarely uses marijuana, maybe once in a month.  She said that she last consumed THC in a edible a few days ago.    Kimberly Ingram has a anxious demeanor.  She also has a depressed affect too.  Eye contact is fair and Kimberly Ingram is oriented x4.  Kimberly Ingram is not responding to internal stimuli.  Kimberly Ingram is not engaged in delusional thought process.  Kimberly Ingram thought process is logical and coherent.  Kimberly Ingram reports getting less than 4H/D of sleep for the last few weeks. She has had a poor appetite and has lost 10 pounds over the last few weeks.  Pt has no previous inpatient or  outpatient psychiatric care.  -Clinician discussed Kimberly Ingram care with Elenore Paddy, NP who recommends inpatient care.  Pt had been IVC'ed by the EDP Dr. Renaye Rakers.  AC Tobias Alexander said that Kimberly Ingram can come to [facility] 405-1 to Dr. Jola Babinski.  Clinician informed nurse Shanda Bumps.  Diagnosis: F33.2 MDD recurrent, severe  Past Medical History:  Past Medical History:  Diagnosis Date  . Asthma     Past Surgical History:  Procedure Laterality Date  . KNEE SURGERY      Family History: History reviewed. No pertinent family history.  Social History:  reports that she has never smoked. She has never used smokeless tobacco. She reports previous drug use. Drug: Marijuana. She reports that she does not drink alcohol.  Additional Social History:  Alcohol / Drug Use Pain Medications: No pain meds Prescriptions: No prescribed medications Over the Counter: Albuterol and a flowvent History of alcohol / drug use?: Yes Substance #1 Name of Substance 1: Marijuana 1 - Age of First Use: 19 years of age 30 - Amount (size/oz): Very little 1 - Frequency: once in a month 1 - Duration: off and on 1 - Last Use / Amount: A week ago  CIWA: CIWA-Ar BP: 125/79 Pulse Rate: 63 COWS:    Allergies:  Allergies  Allergen Reactions  . Peanut-Containing Drug Products Swelling    Home Medications: (Not in a hospital  admission)   OB/GYN Status:  No LMP recorded.  General Assessment Data Location of Assessment: [facility] TTS Assessment: In system Is this a Tele or Face-to-Face Assessment?: Tele Assessment Is this an Initial Assessment or a Re-assessment for this encounter?: Initial Assessment Kimberly Ingram Accompanied by:: N/A Language Other than English: No Living Arrangements: Other (Comment) (Student apartment off campus from [employer]) What gender do you identify as?: Female Date Telepsych consult ordered in [facility]: 03/27/20 Time Telepsych consult ordered in [facility]: 2254 Marital status: Single Pregnancy Status: No Living  Arrangements: Non-relatives/Friends (Two roommates) Can pt return to current living arrangement?: Yes Admission Status: Voluntary Is Kimberly Ingram capable of signing voluntary admission?: Yes Referral Source: Self/Family/Friend Insurance type: Financial risk analyst Care Plan Living Arrangements: Non-relatives/Friends (Two roommates) Name of Psychiatrist: None Name of Therapist: None  Education Status Is Kimberly Ingram currently in school?: Yes Current Grade: senior year Name of school: [employer] Contact person: Kimberly Ingram  Risk to self with the past 6 months Suicidal Ideation: Yes-Currently Present (Suicidal thoughts yesterday) Has Kimberly Ingram been a risk to self within the past 6 months prior to admission? : No Suicidal Intent: No Has Kimberly Ingram had any suicidal intent within the past 6 months prior to admission? : No Is Kimberly Ingram at risk for suicide?: No Suicidal Plan?: No Has Kimberly Ingram had any suicidal plan within the past 6 months prior to admission? : No Access to Means: No What has been your use of drugs/alcohol within the last 12 months?: THC Previous Attempts/Gestures: No How many times?: 0 Other Self Harm Risks: None Triggers for Past Attempts: None known Intentional Self Injurious Behavior: None Family Suicide History: No Recent stressful life event(s): Turmoil (Comment) (Work, school balance.  Feeling overwhelmed) Persecutory voices/beliefs?: No Depression: Yes Depression Symptoms: Loss of interest in usual pleasures, Despondent, Tearfulness, Isolating, Insomnia Substance abuse history and/or treatment for substance abuse?: No Suicide prevention information given to non-admitted patients: Not applicable  Risk to Others within the past 6 months Homicidal Ideation: No Does Kimberly Ingram have any lifetime risk of violence toward others beyond the six months prior to admission? : No Thoughts of Harm to Others: No Current Homicidal Intent: No Current Homicidal Plan: No Access to Homicidal  Means: No Identified Victim: No one' History of harm to others?: No Assessment of Violence: None Noted Violent Behavior Description: None reported Does Kimberly Ingram have access to weapons?: No Criminal Charges Pending?: No Does Kimberly Ingram have a court date: No Is Kimberly Ingram on probation?: No  Psychosis Hallucinations: None noted Delusions: None noted  Mental Status Report Appearance/Hygiene: Unremarkable, In scrubs Eye Contact: Fair Motor Activity: Freedom of movement, Unremarkable Speech: Logical/coherent Level of Consciousness: Alert Mood: Depressed, Anxious, Sad Affect: Anxious, Sad Anxiety Level: Panic Attacks Panic attack frequency: 2x/M Most recent panic attack: Not sure Thought Processes: Coherent, Relevant Judgement: Partial Orientation: Person, Place, Situation, Time Obsessive Compulsive Thoughts/Behaviors: None  Cognitive Functioning Concentration: Decreased Memory: Remote Intact, Recent Impaired Is Kimberly Ingram IDD: No Insight: Good Impulse Control: Fair Appetite: Poor Have you had any weight changes? : Loss Amount of the weight change? (lbs):  (10lbs in <a month) Sleep: Decreased Total Hours of Sleep:  (<4H/D) Vegetative Symptoms: None  ADLScreening [facility] Assessment Services) Kimberly Ingram's cognitive ability adequate to safely complete daily activities?: Yes Kimberly Ingram able to express need for assistance with ADLs?: Yes Independently performs ADLs?: Yes (appropriate for developmental age)  Prior Inpatient Therapy Prior Inpatient Therapy: No  Prior Outpatient Therapy Prior Outpatient Therapy: No Does Kimberly Ingram have an ACCT team?: No Does  Kimberly Ingram have Intensive In-House Services?  : No Does Kimberly Ingram have [facility] services? : No Does Kimberly Ingram have P4CC services?: No  ADL Screening (condition at time of admission) Kimberly Ingram's cognitive ability adequate to safely complete daily activities?: Yes Is the Kimberly Ingram deaf or have difficulty hearing?: No Does the Kimberly Ingram have difficulty  seeing, even when wearing glasses/contacts?: No Does the Kimberly Ingram have difficulty concentrating, remembering, or making decisions?: No Kimberly Ingram able to express need for assistance with ADLs?: Yes Does the Kimberly Ingram have difficulty dressing or bathing?: No Independently performs ADLs?: Yes (appropriate for developmental age) Does the Kimberly Ingram have difficulty walking or climbing stairs?: No Weakness of Legs: None Weakness of Arms/Hands: None       Abuse/Neglect Assessment (Assessment to be complete while Kimberly Ingram is alone) Abuse/Neglect Assessment Can Be Completed: Yes Physical Abuse: Denies Verbal Abuse: Denies Sexual Abuse: Denies Exploitation of Kimberly Ingram/Kimberly Ingram's resources: Denies Self-Neglect: Denies                Disposition:  Disposition Initial Assessment Completed for this Encounter: Yes Kimberly Ingram referred to: Other (Comment) (To be reviewed by [facility])  This service was provided via telemedicine using a 2-way, interactive audio and video technology.  Names of all persons participating in this telemedicine service and their role in this encounter. Name: Mairlyn Tegtmeyer Role: Kimberly Ingram  Name: Beatriz Stallion, M.S. LCAS QP Role: clinician  Name:  Role:   Name:  Role:     Alexandria Lodge 03/28/2020 4:20 AM

## 2020-03-28 NOTE — Progress Notes (Signed)
Patient IVC'd after experiencing a black out for a few hours. She was unable to remember what happened.She reports being found by her friend Joselyn Glassman. She reports living with 2 other roommates. 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 She reports that she had candy bars that she ate which had THC in them.  UDS (+) for thc. She was very surprised that she was admitted denying SI but mentioned feeling suicidal sometimes at ED prior to being admitted here. Skin assessment completed, patient oriented to unit. Safety maintained with 15 min checks.

## 2020-03-28 NOTE — B[facility] Counselor (Signed)
Adult Comprehensive Assessment  Patient ID: Kimberly Ingram, female   DOB: Jun 13, 2000, 19 y.o.   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 Pt reports she has good grades. 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 Family Relationships: pt reports she misses her family because they live in [place] / Lack of resources (include bankruptcy): none reported Housing / Lack of housing: none reported Physical health (include injuries & life threatening diseases): none reported Social relationships: none reported Substance abuse: none reported Bereavement / Loss: none reported  Living/Environment/Situation:  Living Arrangements: Non-relatives/Friends Living conditions (as described by patient or guardian): "[place]" Who else lives in the home?: 2 roomates How long has patient lived in current situation?: May 2020 What is atmosphere in current home: Comfortable  Family History:  Marital status: Single Are you sexually active?: Yes What is your sexual orientation?: "gay" Has your sexual activity been affected by drugs, alcohol, medication, or emotional stress?: none reported Does patient have children?: No  Childhood History:  By whom was/is the patient raised?: Both parents Description of patient's relationship with caregiver when they were a child: "good" Patient's description of current relationship with people who raised him/her: "good" How were you disciplined when you got  in trouble as a child/adolescent?: "I am not sure how to answer that" Does patient have siblings?: Yes Number of Siblings: 1 Description of patient's current relationship with siblings: pt reports that they do not talk much Did patient suffer any verbal/emotional/physical/sexual abuse as a child?: No Did patient suffer from severe childhood neglect?: No Has patient ever been sexually abused/assaulted/raped as an adolescent or adult?: No Was the patient ever a victim of a crime or a disaster?: No Witnessed domestic violence?: No Has patient been affected by domestic violence as an adult?: No  Education:  Highest grade of school patient has completed: Holiday representative in college Currently a student?: Yes Name of school: [employer] How long has the patient attended?: 4th year Learning disability?: No  Employment/Work Situation:   Employment situation: Employed Where is patient currently employed?: [employer] How long has patient been employed?: "recently" Patient's job has been impacted by current illness: No What is the longest time patient has a held a job?: 1 year Where was the patient employed at that time?: [employer] Has patient ever been in the [employer]?: No  Financial Resources:   Surveyor, quantity resources: Income from employment Does patient have a representative payee or guardian?: No  Alcohol/Substance Abuse:   What has been your use of drugs/alcohol within the last 12 months?: pt reports using alcohol 1x weekly at parties and marijuana 1x monthly If attempted suicide, did drugs/alcohol play a role in this?: No Alcohol/Substance Abuse Treatment Hx: Denies past history Has alcohol/substance abuse ever caused legal problems?: No  Social Support System:   Conservation officer, nature Support System: Good Describe Community Support System: parents, friends Type of faith/religion: christian How does patient's faith help to cope with current illness?: "remember there are positives."  Leisure/Recreation:   Do You  Have  Hobbies?: Yes Leisure and Hobbies: "I am very active, workout, read and draw  Strengths/Needs:   What is the patient's perception of their strengths?: "I am a good friend, I listen and I am positive" Patient states they can use these personal strengths during their treatment to contribute to their recovery: "I am fine" Patient states these barriers may affect/interfere with their treatment: none reported Patient states these barriers may affect their return to the community: none reported Other important information patient would like considered in planning for their treatment: none reported  Discharge Plan:   Currently receiving community mental health services: No Patient states concerns and preferences for aftercare planning are: interested in seeing a therapist Patient states they will know when they are safe and ready for discharge when: "I feel fine" Does patient have access to transportation?: Yes Does patient have financial barriers related to discharge medications?: No Will patient be returning to same living situation after discharge?: Yes  Summary/Recommendations:   Summary and Recommendations (to be completed by the evaluator): Kimberly Ingram is a 19 year old female brought via EMS accompanied by [employer] for suicidal ideation. Per EMS, patient's friend found her passed out in her bedroom floor and patient cannot recall what has happened in the past few hours. And there was a plan to IVC the patient. Patient admits that she is feeling very depressed with suicidal ideation but did not disclose any specific plan. Pt reports that her parents are in [place], which is where she is from. Patient says she cannot recall who found her passed out and that she cannot recall even the EMS ride to [facility]. Patient says that right now she is not feeling suicidal. Earlier yesterday she did feel suicidal but with no plan or intention. She denies any previous attempts to end her life. Patient says she has  been very depressed and anxious over the last few weeks. She says that her grades are good. She lives with two roommates in an apartment off campus at [employer]. 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 Patient denies any HI or A/V hallucinations. Patient was positive for THC. She said she rarely uses marijuana, maybe once in a month. She said that she last consumed THC in an edible a few days ago. Patient has an anxious demeanor. She also has a depressed affect too.  Eye contact is fair and patient is oriented x4.  Patient is not responding to internal stimuli.  Patient is not engaged in delusional thought process.  Patient thought process is logical and coherent.  Patient reports getting less than 4H/D of sleep for the last few weeks. She has had a poor appetite and has lost 10 pounds over the last few weeks. Pt has no previous inpatient or outpatient psychiatric care. While here, Kimberly Ingram can benefit from crisis stabilization, medication management, therapeutic milieu, and referrals for services.  Felizardo Hoffmann. 03/28/2020

## 2020-03-28 NOTE — Tx Team (Signed)
Initial Treatment Plan 03/28/2020 7:19 AM [name] SMO:[id number]    PATIENT STRESSORS: Other: college stress and finances   PATIENT STRENGTHS: Ability for insight Average or above average intelligence Capable of independent living Supportive family/friends Work skills   PATIENT IDENTIFIED PROBLEMS: Environmental health practitioner THC use in candy bars                 DISCHARGE CRITERIA:  Adequate post-discharge living arrangements  PRELIMINARY DISCHARGE PLAN: Return to previous living arrangement  PATIENT/FAMILY INVOLVEMENT: This treatment plan has been presented to and reviewed with the patient, Kimberly Ingram, and/or family member.  The patient and family have been given the opportunity to ask questions and make suggestions.  Floyce Stakes, RN 03/28/2020, 7:19 AM

## 2020-03-28 NOTE — [facility] Group Notes (Signed)
.  Psychoeducational Group Note    Date: 03-28-20 Time: 0900-1000    Goal Setting   Purpose of Group: This group helps to provide patients with the steps of setting Ingram goal that is specific, measurable, attainable, realistic and time specific. Ingram discussion on how we keep ourselves stuck with negative self talk.    Participation Level:  Did not attend   Kimberly Ingram  

## 2020-03-29 DIAGNOSIS — F1210 Cannabis abuse, uncomplicated: Secondary | ICD-10-CM | POA: Diagnosis not present

## 2020-03-29 DIAGNOSIS — F419 Anxiety disorder, unspecified: Secondary | ICD-10-CM | POA: Diagnosis not present

## 2020-03-29 DIAGNOSIS — F332 Major depressive disorder, recurrent severe without psychotic features: Principal | ICD-10-CM

## 2020-03-29 DIAGNOSIS — J45909 Unspecified asthma, uncomplicated: Secondary | ICD-10-CM | POA: Diagnosis present

## 2020-03-29 NOTE — Progress Notes (Signed)
[facility] MD Progress Note  03/29/2020 11:12 AM Kimberly Ingram  MRN:  [id number]   Subjective: "I remember what happened, I went running and had an asthma attack and did not have my phone.  When I got home I could not find my inhaler and had a panic attack and passed out.  I had a really bad day and was feeling stressed.  My phone had been blowing up, because people could not get a hold of me."   Principal Problem: MDD (major depressive disorder), recurrent episode, severe (HCC) Diagnosis: Principal Problem:   MDD (major depressive disorder), recurrent episode, severe (HCC) Active Problems:   Marijuana dependence (HCC)   Anxious mood   Asthma  Total Time spent with patient: 35 minutes   Past Psychiatric History: Denies any  HPI: Kimberly Ingram is a 19 y.o. female with no documented past psychiatric history who was brought via EMS by [facility] police after being found passed out on her bedroom floor with concerns of suicide attempt.  In the emergency room, patient endorsed feeling very depressed with suicidal ideation, but denies plan or attempt.  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  She lives with 2 roommates in an apartment.  Patient has provided consent for collateral, however context of not been available after multiple attempts by social work.  Objective: Patient is seen, nurse's notes are reviewed.  Patient continues to deny suicidal ideation.  She denies HI, or AVH.  She endorses some stress, but now minimizes this stating that she typically is very able to manage stress.  She does not feel that she needs medication or therapy.  She reports that she has a good support group with her family and friends locally.  She does state that she will be moving to [place] after graduation where she will live alone.  She states she is looking forward to living independently.  She states that she does have friends in [place], and has not worried about losing her support  system, as she has been 4 years away from [place] and still feels supported by her family.  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  Patient at this time is declining services or medication management.  She has attended some groups.  She reports her sleep and appetite are good.  Nurses document 6 hours of sleep. Labs from emergency department are reviewed and are unremarkable other than urine drug screen positive for THC.  This was discussed with patient, who believes that marijuana use is not problematic for her.  She states she does not use regularly.  She reports alcohol when at parties, and denies other drug use.  She denies access to weapons.   Past Medical History:  Past Medical History:  Diagnosis Date  . Asthma     Past Surgical History:  Procedure Laterality Date  . KNEE SURGERY     Family History: History reviewed. No pertinent family history. Family Psychiatric  History: Denies   Social History:  Social History   Substance and Sexual Activity  Alcohol Use Yes  . Alcohol/week: 5.0 standard drinks  . Types: 5 Shots of liquor per week   Comment: at a party will do 5 shots     Social History   Substance and Sexual Activity  Drug Use Not Currently  . Types: Marijuana    Social History   Socioeconomic History  . Marital status: Single  Spouse name: Not on file  . Number of children: 0  . Years of education: Not on file  . Highest education level: Some college, no degree  Occupational History  . Not on file  Tobacco Use  . Smoking status: Never Smoker  . Smokeless tobacco: Never Used  Vaping Use  . Vaping Use: Never used  Substance and Sexual Activity  . Alcohol use: Yes    Alcohol/week: 5.0 standard drinks    Types: 5 Shots of liquor per week    Comment: at a party will do 5 shots  . Drug use: Not Currently    Types: Marijuana  . Sexual activity: Yes  Other Topics Concern  . Not on  file  Social History Narrative  . Not on file   Social Determinants of Health   Financial Resource Strain:   . Difficulty of Paying Living Expenses: Not on file  Food Insecurity:   . Worried About Programme researcher, broadcasting/film/video in the Last Year: Not on file  . Ran Out of Food in the Last Year: Not on file  Transportation Needs:   . Lack of Transportation (Medical): Not on file  . Lack of Transportation (Non-Medical): Not on file  Physical Activity:   . Days of Exercise per Week: Not on file  . Minutes of Exercise per Session: Not on file  Stress:   . Feeling of Stress : Not on file  Social Connections:   . Frequency of Communication with Friends and Family: Not on file  . Frequency of Social Gatherings with Friends and Family: Not on file  . Attends Religious Services: Not on file  . Active Member of Clubs or Organizations: Not on file  . Attends Banker Meetings: Not on file  . 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 acetaminophen (TYLENOL) tablet 650 mg  650 mg Oral Q6H PRN Gillermo Murdoch, NP      . alum & mag hydroxide-simeth (MAALOX/MYLANTA) 200-200-20 MG/5ML suspension 30 mL  30 mL Oral Q4H PRN Gillermo Murdoch, NP      . magnesium hydroxide (MILK OF MAGNESIA) suspension 30 mL  30 mL Oral Daily PRN Gillermo Murdoch, NP        Lab Results:  Results for orders placed or performed during the hospital encounter of 03/27/20 (from the past 48 hour(s))  Rapid urine drug screen (hospital performed)     Status: Abnormal   Collection Time: 03/27/20  5:17 PM  Result Value Ref Range   Opiates NONE DETECTED NONE DETECTED   Cocaine NONE  DETECTED NONE DETECTED   Benzodiazepines NONE DETECTED NONE DETECTED   Amphetamines NONE DETECTED NONE DETECTED   Tetrahydrocannabinol POSITIVE (A) NONE DETECTED   Barbiturates NONE DETECTED NONE DETECTED    Comment: (NOTE) DRUG SCREEN FOR MEDICAL PURPOSES ONLY.  IF CONFIRMATION IS NEEDED FOR ANY PURPOSE, NOTIFY LAB WITHIN 5 DAYS.  LOWEST DETECTABLE LIMITS FOR URINE DRUG SCREEN Drug Class                     Cutoff (ng/mL) Amphetamine and metabolites  1000 Barbiturate and metabolites    200 Benzodiazepine                 200 Tricyclics and metabolites     300 Opiates and metabolites        300 Cocaine and metabolites        300 THC                            50 Performed at [facility], [address] [place], [place] [address]   Comprehensive metabolic panel     Status: None   Collection Time: 03/27/20  5:45 PM  Result Value Ref Range   Sodium 138 135 - 145 mmol/L   Potassium 3.9 3.5 - 5.1 mmol/L   Chloride 102 98 - 111 mmol/L   CO2 27 22 - 32 mmol/L   Glucose, Bld 96 70 - 99 mg/dL    Comment: Glucose reference range applies only to samples taken after fasting for at least 8 hours.   BUN 8 6 - 20 mg/dL   Creatinine, Ser 6.04 0.44 - 1.00 mg/dL   Calcium 9.0 8.9 - 54.0 mg/dL   Total Protein 8.0 6.5 - 8.1 g/dL   Albumin 4.1 3.5 - 5.0 g/dL   AST 19 15 - 41 U/L   ALT 25 0 - 44 U/L   Alkaline Phosphatase 65 38 - 126 U/L   Total Bilirubin 1.0 0.3 - 1.2 mg/dL   GFR, Estimated >98 >11 mL/min    Comment: (NOTE) Calculated using the CKD-EPI Creatinine Equation (2021)    Anion gap 9 5 - 15    Comment: Performed at [facility], [address] [place], [place] [address]  Ethanol     Status: None   Collection Time: 03/27/20  5:45 PM  Result Value Ref Range   Alcohol, Ethyl (B) <10 <10 mg/dL    Comment: (NOTE) Lowest detectable limit for serum alcohol is 10 mg/dL.  For medical purposes only. Performed at [facility], [address] [place], [place] [address]   Salicylate level     Status: Abnormal   Collection Time: 03/27/20  5:45 PM  Result Value Ref Range   Salicylate Lvl <7.0 (L) 7.0 - 30.0 mg/dL    Comment: Performed at [facility], [address], [place], [place] [address]  Acetaminophen level     Status: Abnormal   Collection Time: 03/27/20  5:45 PM  Result Value Ref Range   Acetaminophen (Tylenol), Serum <10 (L) 10 - 30 ug/mL    Comment: (NOTE) Therapeutic concentrations vary significantly. A range of 10-30 ug/mL  may be an effective concentration for many patients. However, some  are best treated at concentrations outside of this range. Acetaminophen concentrations >150 ug/mL at 4 hours after ingestion  and >50 ug/mL at 12 hours after ingestion are often associated with  toxic reactions.  Performed at [facility], [address], [place], [place] [address]   cbc     Status: None   Collection Time: 03/27/20  5:45 PM  Result Value Ref Range   WBC 4.1 4.0 - 10.5 K/uL   RBC 4.09 3.87 - 5.11 MIL/uL   Hemoglobin 12.4 12.0 - 15.0 g/dL   HCT 96.2 36 - 46 %   MCV 92.7 80.0 - 100.0 fL   MCH 30.3 26.0 - 34.0 pg   MCHC 32.7 30.0 - 36.0 g/dL  RDW 13.3 11.5 - 15.5 %   Platelets 250 150 - 400 K/uL   nRBC 0.0 0.0 - 0.2 %    Comment: Performed at [facility], [address] [place], [place] [id number]  I-Stat beta hCG blood, ED     Status: None   Collection Time: 03/27/20  5:50 PM  Result Value Ref Range   I-stat hCG, quantitative <5.0 <5 mIU/mL   Comment 3            Comment:   GEST. AGE      CONC.  (mIU/mL)   <=1 WEEK        5 - 50     2 WEEKS       50 - 500     3 WEEKS       100 - 10,000     4 WEEKS     1,000 - 30,000        FEMALE AND NON-PREGNANT FEMALE:     LESS THAN 5 mIU/mL   Respiratory Panel by RT PCR (Flu A&B, Covid) - Nasopharyngeal Swab     Status: None   Collection Time: 03/27/20  5:52 PM   Specimen:  Nasopharyngeal Swab  Result Value Ref Range   SARS Coronavirus 2 by RT PCR NEGATIVE NEGATIVE    Comment: (NOTE) SARS-CoV-2 target nucleic acids are NOT DETECTED.  The SARS-CoV-2 RNA is generally detectable in upper respiratoy specimens during the acute phase of infection. The lowest concentration of SARS-CoV-2 viral copies this assay can detect is 131 copies/mL. A negative result does not preclude SARS-Cov-2 infection and should not be used as the sole basis for treatment or other patient management decisions. A negative result may occur with  improper specimen collection/handling, submission of specimen other than nasopharyngeal swab, presence of viral mutation(s) within the areas targeted by this assay, and inadequate number of viral copies (<131 copies/mL). A negative result must be combined with clinical observations, patient history, and epidemiological information. The expected result is Negative.  Fact Sheet for Patients:  https://www.moore.com/https://www.fda.gov/media/142436/download  Fact Sheet for Healthcare Providers:  https://www.young.biz/https://www.fda.gov/media/142435/download  This test is no t yet approved or cleared by the [place] FDA and  has been authorized for detection and/or diagnosis of SARS-CoV-2 by FDA under an Emergency Use Authorization (EUA). This EUA will remain  in effect (meaning this test can be used) for the duration of the COVID-19 declaration under Section 564(b)(1) of the Act, 21 U.S.C. section 360bbb-3(b)(1), unless the authorization is terminated or revoked sooner.     Influenza A by PCR NEGATIVE NEGATIVE   Influenza B by PCR NEGATIVE NEGATIVE    Comment: (NOTE) The Xpert Xpress SARS-CoV-2/FLU/RSV assay is intended as an aid in  the diagnosis of influenza from Nasopharyngeal swab specimens and  should not be used as a sole basis for treatment. Nasal washings and  aspirates are unacceptable for Xpert Xpress SARS-CoV-2/FLU/RSV  testing.  Fact Sheet for  Patients: https://www.moore.com/https://www.fda.gov/media/142436/download  Fact Sheet for Healthcare Providers: https://www.young.biz/https://www.fda.gov/media/142435/download  This test is not yet approved or cleared by the [place] FDA and  has been authorized for detection and/or diagnosis of SARS-CoV-2 by  FDA under an Emergency Use Authorization (EUA). This EUA will remain  in effect (meaning this test can be used) for the duration of the  Covid-19 declaration under Section 564(b)(1) of the Act, 21  U.S.C. section 360bbb-3(b)(1), unless the authorization is  terminated or revoked. Performed at [facility], [address] [place],  [place] [address]     Blood Alcohol level:  Lab Results  Component Value Date   ETH <10 03/27/2020    Metabolic Disorder Labs: No results found for: HGBA1C, MPG No results found for: PROLACTIN No results found for: CHOL, TRIG, HDL, CHOLHDL, VLDL, LDLCALC  Physical Findings: AIMS: Facial and Oral Movements Muscles of Facial Expression: None, normal Lips and Perioral Area: None, normal Jaw: None, normal Tongue: None, normal,Extremity Movements Upper (arms, wrists, hands, fingers): None, normal Lower (legs, knees, ankles, toes): None, normal, Trunk Movements Neck, shoulders, hips: None, normal, Overall Severity Severity of abnormal movements (highest score from questions above): None, normal Incapacitation due to abnormal movements: None, normal Patient's awareness of abnormal movements (rate only patient's report): No Awareness, Dental Status Current problems with teeth and/or dentures?: No Does patient usually wear dentures?: No  CIWA:    COWS:     Musculoskeletal: Strength & Muscle Tone: within normal limits Gait & Station: normal Patient leans: N/A  Psychiatric Specialty Exam: Physical Exam Vitals and nursing note reviewed. Exam conducted with a chaperone present.  Constitutional:      Appearance: Normal appearance. She is normal weight.  HENT:      Head: Normocephalic and atraumatic.     Nose: Nose normal.  Eyes:     Extraocular Movements: Extraocular movements intact.  Cardiovascular:     Rate and Rhythm: Normal rate.  Pulmonary:     Effort: Pulmonary effort is normal. No respiratory distress.     Comments: Has asthma  Musculoskeletal:     Cervical back: Normal range of motion.  Neurological:     General: No focal deficit present.     Mental Status: She is alert and oriented to person, place, and time.     Review of Systems  Constitutional: Negative.   Respiratory: Negative for shortness of breath and wheezing.   Cardiovascular: Negative.   Gastrointestinal: Negative.   Musculoskeletal: Negative.   Neurological: Negative.   Psychiatric/Behavioral: Negative for agitation, behavioral problems, confusion, decreased concentration, dysphoric mood, hallucinations, self-injury, sleep disturbance and suicidal ideas. The patient is nervous/anxious.     Blood pressure (!) 115/56, pulse 72, temperature 97.7 F (36.5 C), temperature source Oral, resp. rate 16, height 5\' 6"  (1.676 m), weight 65.3 kg, SpO2 99 %.Body mass index is 23.24 kg/m.  General Appearance: Casual and Neat  Eye Contact:  Good  Speech:  Clear and Coherent and Normal Rate  Volume:  Normal  Mood:  Euthymic  Affect:  Congruent  Thought Process:  Goal Directed and Descriptions of Associations: Intact  Orientation:  Full (Time, Place, and Person)  Thought Content:  Logical and Hallucinations: None  Suicidal Thoughts:  Denies  Homicidal Thoughts:  No  Memory:  Immediate;   Fair Recent;   Had poor recall prior to coming to the hospital Remote;   Good  Judgement:  Other:  Limited  Insight:  Shallow  Psychomotor Activity:  Normal  Concentration:  Concentration: Fair and Attention Span: Fair  Recall:  Fair  Fund of Knowledge:  Good  Language:  Good  Akathisia:  No  Handed:  Right  AIMS (if indicated):   0  Assets:  Communication Skills Desire for  Improvement Financial Resources/Insurance Housing Physical Health Resilience Social Support Talents/Skills Vocational/Educational  ADL's:  Intact  Cognition:  WNL  Sleep:  Number of Hours: 6     Treatment Plan Summary: Daily contact with patient to assess and evaluate symptoms and progress in treatment and Medication management   Assessment:  Kimberly Ingram is a 19 y.o. female with no past psychiatric history who presented after being found down in her apartment.  Patient denies suicide attempt, and now states that she had an asthma attack which triggered a panic attack.  Awaiting collateral from friends and/or family.   Diagnosis: MDD (major depressive disorder), recurrent episode, severe (HCC)    Pertinent findings today: 1.  Patient pleasant upon approach 2.  Minimizing symptoms that led to hospitalization and trying to relate possible cause of need for EMS 3.  Minimizing substance and alcohol use 4.  Collateral from friends and her family is pending.   Scheduled medications/labs: -Labs reviewed from emergency department which were unremarkable other than urine drug screen positive for marijuana -Patient declines medications    PRN's : -Tylenol for pain -Milk of magnesia and Mylanta for GI symptoms    Psychosocial:   1. Encouragement to attend group therapies. 2. Encourage patient to learn and implement coping strategies to avoid need for PRN medications. 3. Encouragement for medication compliance. 4. Disposition in progress, appreciate social work assistance.  Patient currently declining offers for follow-up with outpatient psychiatry or therapy. 5. Patient agreeable to obtaining collateral from friends.  Encourage collateral from family as well.    Mariel Craft, MD 03/29/2020, 11:12 AM

## 2020-03-29 NOTE — Progress Notes (Addendum)
   03/29/20 0622  Vital Signs  Temp 97.7 F (36.5 C)  Temp Source Oral  Pulse Rate 66  Pulse Rate Source Monitor  BP 111/67  BP Location Left Arm  BP Method Automatic  Patient Position (if appropriate) Sitting  Oxygen Therapy  SpO2 99 %   D: Patient denies SI/HI/AVH. Patient denies anxiety and depression. Pt. Out in open areas and is social with staff and peers. A:  Support and encouragement provided Routine safety checks conducted every 15 minutes. Patient  Informed to notify staff with any concerns.   R:  Safety maintained.  1829 Late Note: Pt. Wanted to know what is going on and what is the plan. Pt. Reported that she has classes at 1100 (in person)  and 2:00 on Monday. Pt. Needs notes for school and work. Pt. Can get a ride from [place] [phone]. He has her keys to her apartment.

## 2020-03-30 NOTE — Discharge Summary (Signed)
Physician Discharge Summary Note  Patient:  Kimberly Ingram is an 19 y.o., female MRN:  [id number] DOB:  Mar 30, 2001 Patient phone:  [phone] (home)  Patient address:   [address] [place] [address],  Total Time spent with patient: 15 minutes  Date of Admission:  03/28/2020 Date of Discharge: 03/30/20  Reason for Admission:  suicidal ideation  Principal Problem: MDD (major depressive disorder), recurrent episode, severe (HCC) Discharge Diagnoses: Principal Problem:   MDD (major depressive disorder), recurrent episode, severe (HCC) Active Problems:   Marijuana dependence (HCC)   Anxious mood   Asthma   Past Psychiatric History: Denies  Past Medical History:  Past Medical History:  Diagnosis Date  . Asthma     Past Surgical History:  Procedure Laterality Date  . KNEE SURGERY     Family History: History reviewed. No pertinent family history. Family Psychiatric  History: Denies Social History:  Social History   Substance and Sexual Activity  Alcohol Use Yes  . Alcohol/week: 5.0 standard drinks  . Types: 5 Shots of liquor per week   Comment: at a party will do 5 shots     Social History   Substance and Sexual Activity  Drug Use Not Currently  . Types: Marijuana    Social History   Socioeconomic History  . Marital status: Single    Spouse name: Not on file  . Number of children: 0  . Years of education: Not on file  . Highest education level: Some college, no degree  Occupational History  . Not on file  Tobacco Use  . Smoking status: Never Smoker  . Smokeless tobacco: Never Used  Vaping Use  . Vaping Use: Never used  Substance and Sexual Activity  . Alcohol use: Yes    Alcohol/week: 5.0 standard drinks    Types: 5 Shots of liquor per week    Comment: at a party will do 5 shots  . Drug use: Not Currently    Types: Marijuana  . Sexual activity: Yes  Other Topics Concern  . Not on file  Social History Narrative  . Not on file    Social Determinants of Health   Financial Resource Strain:   . Difficulty of Paying Living Expenses: Not on file  Food Insecurity:   . Worried About Programme researcher, broadcasting/film/video in the Last Year: Not on file  . Ran Out of Food in the Last Year: Not on file  Transportation Needs:   . Lack of Transportation (Medical): Not on file  . Lack of Transportation (Non-Medical): Not on file  Physical Activity:   . Days of Exercise per Week: Not on file  . Minutes of Exercise per Session: Not on file  Stress:   . Feeling of Stress : Not on file  Social Connections:   . Frequency of Communication with Friends and Family: Not on file  . Frequency of Social Gatherings with Friends and Family: Not on file  . Attends Religious Services: Not on file  . Active Member of Clubs or Organizations: Not on file  . Attends Banker Meetings: Not on file  . Marital Status: Not on file    Hospital Course:  Per ED provider assessment: 19 yr old female brought here via EMS accompanied by [employer] for suicidal ideation. Per EMS, patient's father found her passed out in her bedroom floor and patient cannot recall what has happened in the past few hours. And there was a plan to IVC the patient. Patient  admits that she is feeling very depressed with suicidal ideation but did not disclose any specific plan. Per TTS Assessment note:  Pt reports that her parents are in [place], which is where she is from. Patient says she cannot recall who found her passed out and that she cannot recall even the EMS ride to [facility]. Patient says that right now she is not feeling suicidal. Earlier yesterday she did feel suicidal but with no plan or intention. She denies any previous attempts to end her life. Patient says she has been very depressed and anxious over the last few weeks. She says that her grades are good. She lives with two roommates in an apartment off campus at [employer]. 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  Patient denies any HI or A/V hallucinations. Patient was positive for THC. She said she rarely uses marijuana, maybe once in a month. She said that she last consumed THC in a edible a few days ago.  Patient has a anxious demeanor. She also has a depressed affect too. Eye contact is fair and patient is oriented x4. Patient is not responding to internal stimuli. Patient is not engaged in delusional thought process. Patient thought process is logical and coherent. Patient reports getting less than 4H/D of sleep for the last few weeks. She has had a poor appetite and has lost 10 pounds over the last few weeks.  Intake Assessment 03/28/20:  Kimberly Ingram, 19 y.o., female patient seen face to face by this provider, consulted with Dr. Viviano Simas; treatment team and chart reviewed on 03/28/20.  On evaluation Kimberly Ingram reports she has not psychiatric history and that she did not try to kill herself.  "I think I just had a panic attack and fell out.  I wasn't trying to kill myself or anything like that.  I guess this all stems from me sending a text message to my mom telling her that sometimes I feel like I just don't want to be here.  I wasn't telling her that I wanted to die or that I was going to kill myself; it was just how I was feeling at the time."  Patient denies prior psychiatric history, hospitalization, and psychotropic medications.  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  States that she lives with 2 other roommates.  States that her family lives in [place] and she speaks to them regularly.  Patient states that she is not interested in taking any medications for anxiety "Yesterday was just an odd day for me; I usually don't experience anything like that."  Patient asked about positive THC on UDS "I was really surprised that, that showed up on there.  About 3 days ago I ate a candy bar that was given to me by a friend that was a Delta 8 or 10 CBD.  I  didn't think it showed up like that.  No I don't do drugs and I may drink alcohol occasionally.  Patient states that she will sign consent to speak to her roommates, mother, "anyone that can collaborate my story that I am not a danger to myself and that I don't want to kill myself." During evaluation Kimberly Ingram is in the day room interacting with peers.  She is alert/oriented x 4; calm/cooperative; and mood is congruent with affect.  She does not appear to be responding to internal/external stimuli or delusional thoughts.  Patient denies suicidal/self-harm/homicidal ideation,  psychosis, and paranoia.  Patient answered question appropriately.    Kimberly Ingram was admitted for suicidal ideation after being found unconscious at home with no memory of the last few hours. She denied any memory of overdosing. [facility]. She declined psychotropic medications. She declined group therapy on the unit. She has shown stable mood, affect, sleep, and interaction. She denies any SI/HI/AVH and contracts for safety. Collateral information was obtained from patient's mother and friend, who deny safety concerns for discharge. No medications at time of discharge. She agrees to [facility] at [facility] (see below). Patient is provided with prescriptions for medications upon discharge. Her friend is picking her up for discharge home.  Physical Findings: AIMS: Facial and Oral Movements Muscles of Facial Expression: None, normal Lips and Perioral Area: None, normal Jaw: None, normal Tongue: None, normal,Extremity Movements Upper (arms, wrists, hands, fingers): None, normal Lower (legs, knees, ankles, toes): None, normal, Trunk Movements Neck, shoulders, hips: None, normal, Overall Severity Severity of abnormal movements (highest score from questions above): None, normal Incapacitation due to abnormal movements: None, normal Patient's awareness of abnormal movements (rate only  patient's report): No Awareness, Dental Status Current problems with teeth and/or dentures?: No Does patient usually wear dentures?: No  CIWA:    COWS:     Musculoskeletal: Strength & Muscle Tone: within normal limits Gait & Station: normal Patient leans: N/A  Psychiatric Specialty Exam: Physical Exam Vitals and nursing note reviewed.  Constitutional:      Appearance: She is well-developed.  Pulmonary:     Effort: Pulmonary effort is normal.  Musculoskeletal:        General: Normal range of motion.  Neurological:     Mental Status: She is alert and oriented to person, place, and time.     Review of Systems  Constitutional: Negative.   Respiratory: Negative for cough and shortness of breath.   Psychiatric/Behavioral: Negative for agitation, behavioral problems, confusion, decreased concentration, dysphoric mood, hallucinations, self-injury, sleep disturbance and suicidal ideas. The patient is not nervous/anxious and is not hyperactive.     Blood pressure (!) 134/123, pulse (!) 124, temperature 98.2 F (36.8 C), temperature source Oral, resp. rate 16, height 5\' 6"  (1.676 m), weight 65.3 kg, SpO2 99 %.Body mass index is 23.24 kg/m.  See MD's discharge SRA    Have you used any form of tobacco in the last 30 days? (Cigarettes, Smokeless Tobacco, Cigars, and/or Pipes): No  Has this patient used any form of tobacco in the last 30 days? (Cigarettes, Smokeless Tobacco, Cigars, and/or Pipes)  No  Blood Alcohol level:  Lab Results  Component Value Date   ETH <10 03/27/2020    Metabolic Disorder Labs:  No results found for: HGBA1C, MPG No results found for: PROLACTIN No results found for: CHOL, TRIG, HDL, CHOLHDL, VLDL, LDLCALC  See Psychiatric Specialty Exam and Suicide Risk Assessment completed by Attending Physician prior to discharge.  Discharge destination:  Home  Is patient on multiple antipsychotic therapies at discharge:  No   Has Patient had three or more failed  trials of antipsychotic monotherapy by history:  No  Recommended Plan for Multiple Antipsychotic Therapies: NA  Discharge Instructions    Discharge instructions   Complete by: As directed    Activity as tolerated. Diet as recommended by primary care physician. Keep all scheduled follow-up appointments as recommended.     Allergies as of 03/30/2020      Reactions   Peanut-containing  Drug Products Swelling      Medication List    STOP taking these medications   albuterol 108 (90 Base) MCG/ACT inhaler Commonly known as: VENTOLIN HFA   fluticasone 44 MCG/ACT inhaler Commonly known as: FLOVENT HFA       Follow-up Information    [facility]. Go on 03/31/2020.   Why: Please go to this provider's office on Monday through Friday, 8:00 to 5:00 pm to see the counselor on duty for assessment to begin services upon discharge. Contact information: [address] [place], [place] [address]                                  P: [phone] F: [phone]               Follow-up recommendations: Activity as tolerated. Diet as recommended by primary care physician. Keep all scheduled follow-up appointments as recommended.   Comments:   Patient is instructed to take all prescribed medications as recommended. Report any side effects or adverse reactions to your outpatient psychiatrist. Patient is instructed to abstain from alcohol and illegal drugs while on prescription medications. In the event of worsening symptoms, patient is instructed to call the crisis hotline, 911, or go to the nearest emergency department for evaluation and treatment.  Signed: Aldean Baker, NP 03/30/2020, 10:52 AM

## 2020-03-30 NOTE — Progress Notes (Signed)
Discharge Note:  Patient denies SI/HI AVH at this time. Discharge instructions, AVS,and transition record gone over with patient. Patient agrees to comply with medication management, follow-up visit, and outpatient therapy. Patient belongings returned to patient. Patient questions and concerns addressed and answered.  Patient ambulatory off unit.  Patient discharged to home with friend.

## 2020-03-30 NOTE — Progress Notes (Signed)
  [facility] Adult Case Management Discharge Plan :  Will you be returning to the same living situation after discharge:  Yes,  To school  At discharge, do you have transportation home?: Yes,  Friend from school  Do you have the ability to pay for your medications: Yes,  Insurance   Release of information consent forms completed and in the chart;  Patient's signature needed at discharge.  Patient to Follow up at:  Follow-up Information    [facility]. Go on 03/31/2020.   Why: Please go to this provider's office on Monday through Friday, 8:00 to 5:00 pm to see the counselor on duty for assessment to begin services upon discharge. Contact information: [address] [place], [place] [address]                                  P: [phone] F: [phone]               Next level of care provider has access to [facility] Link:no  Safety Planning and Suicide Prevention discussed: Yes,  with friend and patient   Have you used any form of tobacco in the last 30 days? (Cigarettes, Smokeless Tobacco, Cigars, and/or Pipes): No  Has patient been referred to the Quitline?: N/A patient is not a smoker  Patient has been referred for addiction treatment: N/A  Aram Beecham, LCSWA 03/30/2020, 9:33 AM

## 2020-03-30 NOTE — BHH Counselor (Signed)
Marbella Markgraf [phone] (Mother)  Mrs. Kirtley states that her daughter has some issues with Asthma and wonders if there is an issue with her Iron levels.  Mrs. Maniaci states that her daughter has started a new job and has midterms and that was the reason for the depression and anxiety.  Mrs. Koval states that she has no concerns for her daughters mental health but feels she needs a change in the environment and that has less stress and fewer negative people.  Mrs. Fetterolf feels that her daughter is fine to go back to school and return home.

## 2020-03-30 NOTE — [facility] Suicide Risk Assessment (Signed)
[facility] INPATIENT:  Family/Significant Other Suicide Prevention Education  Suicide Prevention Education:  Education Completed; Bernarda Caffey Louissant [phone] (Friend from school) has been identified by the patient as the family member/significant other with whom the patient will be residing, and identified as the person(s) who will aid the patient in the event of a mental health crisis (suicidal ideations/suicide attempt).  With written consent from the patient, the family member/significant other has been provided the following suicide prevention education, prior to the and/or following the discharge of the patient.  The suicide prevention education provided includes the following:  Suicide risk factors  Suicide prevention and interventions  National Suicide Hotline telephone number  [facility] assessment telephone number  [facility] Emergency Assistance 911  [facility] and/or Residential Mobile Crisis Unit telephone number  Request made of family/significant other to:  Remove weapons (e.g., guns, rifles, knives), all items previously/currently identified as safety concern.    Remove drugs/medications (over-the-counter, prescriptions, illicit drugs), all items previously/currently identified as a safety concern.  The family member/significant other verbalizes understanding of the suicide prevention education information provided.  The family member/significant other agrees to remove the items of safety concern listed above.   Ms. Phineas Semen states that she has been seeing [place] everyday and goes to the same school with her. Ms. Phineas Semen states that Korrin has been doing well but became overwhelmed and had an Asthma attack on Friday.  Ms. Phineas Semen states she does not have any concerns for Derya and that after talking to her on the phone she seems better since Friday.  Ms. Phineas Semen states that this is the first time this has happened and that Xela is usually level  headed and shows no signs of mental health concern.  Ms. Phineas Semen states that Anissa has no access to firearms or weapons.     Metro Kung Ashwika Freels 03/30/2020, 9:12 AM

## 2020-03-30 NOTE — Progress Notes (Signed)
Pt presents pleasant, calm and cooperative. She denied SI/HI/AVH or self harm. She was mostly on the phone this evening and in the dayroom interacting with her peers with no behavioral issues or concerns voiced. She ate her HS snacks and verbalized the desire to be discharged because she has classes to attend. She is currently resting with + even and unlabored respirations. Q15 minutes observations maintained for safety and support provided as needed.

## 2020-03-30 NOTE — [facility] Suicide Risk Assessment (Signed)
[facility] Discharge Suicide Risk Assessment   Principal Problem: MDD (major depressive disorder), recurrent episode, severe (HCC) Discharge Diagnoses: Principal Problem:   MDD (major depressive disorder), recurrent episode, severe (HCC) Active Problems:   Marijuana dependence (HCC)   Anxious mood   Asthma   Total Time spent with patient: 30 minutes  Musculoskeletal: Strength & Muscle Tone: within normal limits Gait & Station: normal Patient leans: N/A  Psychiatric Specialty Exam: Review of Systems  All other systems reviewed and are negative.   Blood pressure (!) 134/123, pulse (!) 124, temperature 98.2 F (36.8 C), temperature source Oral, resp. rate 16, height 5\' 6"  (1.676 m), weight 65.3 kg, SpO2 99 %.Body mass index is 23.24 kg/m.  General Appearance: Casual  Eye Contact::  Good  Speech:  Normal Rate409  Volume:  Normal  Mood:  Euthymic  Affect:  Congruent  Thought Process:  Coherent and Descriptions of Associations: Intact  Orientation:  Full (Time, Place, and Person)  Thought Content:  Logical  Suicidal Thoughts:  No  Homicidal Thoughts:  No  Memory:  Immediate;   Fair Recent;   Fair Remote;   Fair  Judgement:  Intact  Insight:  Fair  Psychomotor Activity:  Normal  Concentration:  Fair  Recall:  [id number] of Knowledge:Fair  Language: Good  Akathisia:  Negative  Handed:  Right  AIMS (if indicated):     Assets:  Desire for Improvement Housing Resilience Social Support  Sleep:  Number of Hours: 6.75  Cognition: WNL  ADL's:  Intact   Mental Status Per Nursing Assessment::   On Admission:  NA  Demographic Factors:  NA  Loss Factors: NA  Historical Factors: Impulsivity  Risk Reduction Factors:   Living with another person, especially a relative, Positive social support and Positive coping skills or problem solving skills  Continued Clinical Symptoms:  Depression:   Impulsivity  Cognitive Features That Contribute To Risk:  None    Suicide Risk:   Minimal: No identifiable suicidal ideation.  Patients presenting with no risk factors but with morbid ruminations; may be classified as minimal risk based on the severity of the depressive symptoms   Follow-up Information    [facility]. Go on 03/31/2020.   Why: Please go to this provider's office on Monday through Friday, 8:00 to 5:00 pm to see the counselor on duty for assessment to begin services upon discharge. Contact information: [address] [place], [place]                                  P: [phone] F: [phone]               Plan Of Care/Follow-up recommendations:  Activity:  ad lib  [phone], MD 03/30/2020, 10:53 AM

## 2020-03-30 NOTE — Tx Team (Signed)
Interdisciplinary Treatment and Diagnostic Plan Update  03/30/2020 Time of Session: 9:45am Kimberly Ingram MRN: [id number]  Principal Diagnosis: MDD (major depressive disorder), recurrent episode, severe (HCC)  Secondary Diagnoses: Principal Problem:   MDD (major depressive disorder), recurrent episode, severe (HCC) Active Problems:   Marijuana dependence (HCC)   Anxious mood   Asthma   Current Medications:  Current Facility-Administered Medications  Medication Dose Route Frequency Provider Last Rate Last Admin  . acetaminophen (TYLENOL) tablet 650 mg  650 mg Oral Q6H PRN Gillermo Murdoch, NP      . alum & mag hydroxide-simeth (MAALOX/MYLANTA) 200-200-20 MG/5ML suspension 30 mL  30 mL Oral Q4H PRN Gillermo Murdoch, NP      . magnesium hydroxide (MILK OF MAGNESIA) suspension 30 mL  30 mL Oral Daily PRN Gillermo Murdoch, NP       PTA Medications: Medications Prior to Admission  Medication Sig Dispense Refill Last Dose  . albuterol (VENTOLIN HFA) 108 (90 Base) MCG/ACT inhaler Inhale 1 puff into the lungs every 6 (six) hours as needed for wheezing or shortness of breath.      . fluticasone (FLOVENT HFA) 44 MCG/ACT inhaler Inhale 2 puffs into the lungs every 6 (six) hours as needed (For shortness of breath).        Patient Stressors: Other: college stress and finances  Patient Strengths: Ability for insight Average or above average intelligence Capable of independent living Supportive family/friends Work skills  Treatment Modalities: Medication Management, Group therapy, Case management,  1 to 1 session with clinician, Psychoeducation, Recreational therapy.   Physician Treatment Plan for Primary Diagnosis: MDD (major depressive disorder), recurrent episode, severe (HCC) Long Term Goal(s): Improvement in symptoms so as ready for discharge Improvement in symptoms so as ready for discharge   Short Term Goals: Ability to identify changes in lifestyle to reduce recurrence  of condition will improve Ability to verbalize feelings will improve Ability to disclose and discuss suicidal ideas Ability to demonstrate self-control will improve Ability to identify and develop effective coping behaviors will improve Ability to maintain clinical measurements within normal limits will improve Compliance with prescribed medications will improve Ability to identify triggers associated with substance abuse/mental health issues will improve Ability to identify changes in lifestyle to reduce recurrence of condition will improve Ability to verbalize feelings will improve Ability to disclose and discuss suicidal ideas Ability to demonstrate self-control will improve Ability to identify and develop effective coping behaviors will improve Ability to maintain clinical measurements within normal limits will improve Compliance with prescribed medications will improve Ability to identify triggers associated with substance abuse/mental health issues will improve  Medication Management: Evaluate patient's response, side effects, and tolerance of medication regimen.  Therapeutic Interventions: 1 to 1 sessions, Unit Group sessions and Medication administration.  Evaluation of Outcomes: Adequate for Discharge  Physician Treatment Plan for Secondary Diagnosis: Principal Problem:   MDD (major depressive disorder), recurrent episode, severe (HCC) Active Problems:   Marijuana dependence (HCC)   Anxious mood   Asthma  Long Term Goal(s): Improvement in symptoms so as ready for discharge Improvement in symptoms so as ready for discharge   Short Term Goals: Ability to identify changes in lifestyle to reduce recurrence of condition will improve Ability to verbalize feelings will improve Ability to disclose and discuss suicidal ideas Ability to demonstrate self-control will improve Ability to identify and develop effective coping behaviors will improve Ability to maintain clinical  measurements within normal limits will improve Compliance with prescribed medications will improve Ability to identify triggers  associated with substance abuse/mental health issues will improve Ability to identify changes in lifestyle to reduce recurrence of condition will improve Ability to verbalize feelings will improve Ability to disclose and discuss suicidal ideas Ability to demonstrate self-control will improve Ability to identify and develop effective coping behaviors will improve Ability to maintain clinical measurements within normal limits will improve Compliance with prescribed medications will improve Ability to identify triggers associated with substance abuse/mental health issues will improve     Medication Management: Evaluate patient's response, side effects, and tolerance of medication regimen.  Therapeutic Interventions: 1 to 1 sessions, Unit Group sessions and Medication administration.  Evaluation of Outcomes: Adequate for Discharge   RN Treatment Plan for Primary Diagnosis: MDD (major depressive disorder), recurrent episode, severe (HCC) Long Term Goal(s): Knowledge of disease and therapeutic regimen to maintain health will improve  Short Term Goals: Ability to demonstrate self-control, Ability to verbalize feelings will improve and Ability to identify and develop effective coping behaviors will improve  Medication Management: RN will administer medications as ordered by provider, will assess and evaluate patient's response and provide education to patient for prescribed medication. RN will report any adverse and/or side effects to prescribing provider.  Therapeutic Interventions: 1 on 1 counseling sessions, Psychoeducation, Medication administration, Evaluate responses to treatment, Monitor vital signs and CBGs as ordered, Perform/monitor CIWA, COWS, AIMS and Fall Risk screenings as ordered, Perform wound care treatments as ordered.  Evaluation of Outcomes: Adequate  for Discharge   LCSW Treatment Plan for Primary Diagnosis: MDD (major depressive disorder), recurrent episode, severe (HCC) Long Term Goal(s): Safe transition to appropriate next level of care at discharge, Engage patient in therapeutic group addressing interpersonal concerns.  Short Term Goals: Engage patient in aftercare planning with referrals and resources, Increase emotional regulation, Facilitate acceptance of mental health diagnosis and concerns and Identify triggers associated with mental health/substance abuse issues  Therapeutic Interventions: Assess for all discharge needs, 1 to 1 time with Social worker, Explore available resources and support systems, Assess for adequacy in community support network, Educate family and significant other(s) on suicide prevention, Complete Psychosocial Assessment, Interpersonal group therapy.  Evaluation of Outcomes: Adequate for Discharge   Progress in Treatment: Attending groups: Yes. Participating in groups: Yes. Taking medication as prescribed: Yes. Toleration medication: Yes. Family/Significant other contact made: Yes, individual(s) contacted:  Willean Schurman [phone] (Mother) Patient understands diagnosis: Yes. Discussing patient identified problems/goals with staff: Yes. Medical problems stabilized or resolved: Yes. Denies suicidal/homicidal ideation: Yes. Issues/concerns per patient self-inventory: Yes. Other:   New problem(s) identified: No, Describe:  pt ready for d/c  New Short Term/Long Term Goal(s):medication stabilization, elimination of SI thoughts, development of comprehensive mental wellness plan.  Patient Goals:  "to stay positive"  Discharge Plan or Barriers: Patient recently admitted. CSW will continue to follow and assess for appropriate referrals and possible discharge planning.  Reason for Continuation of Hospitalization: Anxiety Depression Medication stabilization  Estimated Length of Stay: 3-5  days  Attendees: Patient: Kimberly Ingram 03/30/2020 10:51 AM  Physician: Dr. Cindi Carbon 03/30/2020 10:51 AM  Nursing:  03/30/2020 10:51 AM  RN Care Manager: 03/30/2020 10:51 AM  Social Worker: Fredirick Lathe, LCSWA 03/30/2020 10:51 AM  Recreational Therapist:  03/30/2020 10:51 AM  Other:  03/30/2020 10:51 AM  Other:  03/30/2020 10:51 AM  Other: 03/30/2020 10:51 AM    Scribe for Treatment Team: Felizardo Hoffmann, LCSWA 03/30/2020 10:51 AM

## 2022-04-07 IMAGING — CT CT HEAD W/O CM
3 series · 16 of 46 positions shown, 19 images · non-contrast
Comparison: None.

CLINICAL DATA: Headache, syncope.

EXAM:
CT HEAD WITHOUT CONTRAST
TECHNIQUE: Contiguous axial images were obtained from the base of the skull
through the vertex without intravenous contrast.

[Series 2: head wo · axial · 0.35mm/px · z∈[-123,-3]mm · 10 of 29 slices shown, 13 images]
[im 3/29  brain]
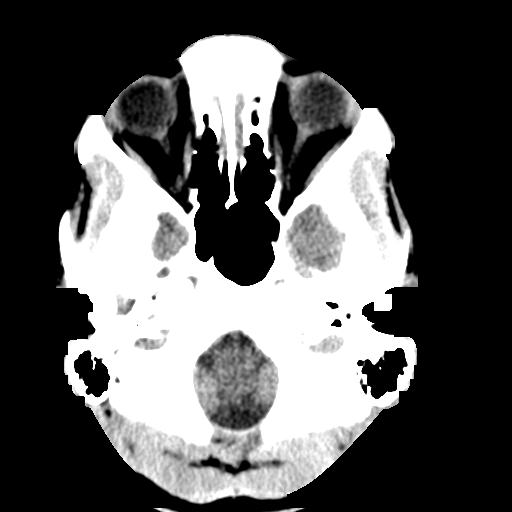
[im 3/29  bone]
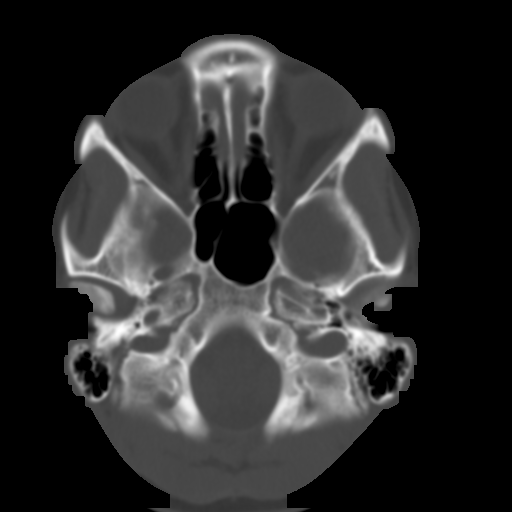
[im 6/29  brain]
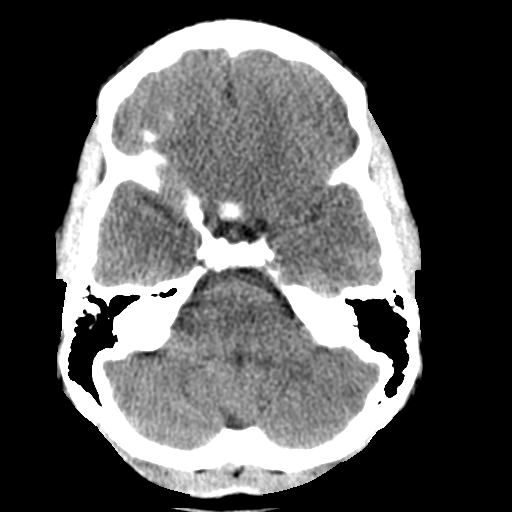
[im 8/29  brain]
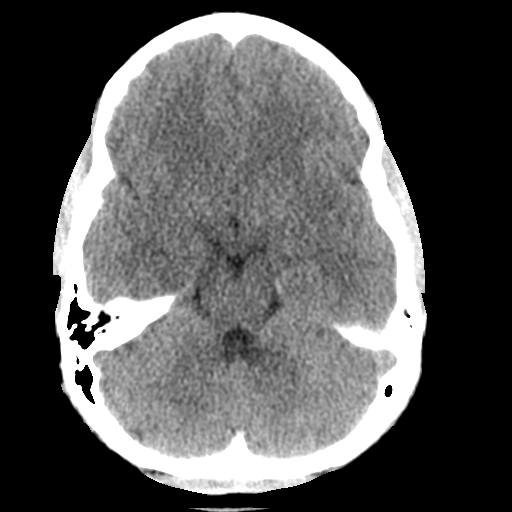
[im 11/29  brain]
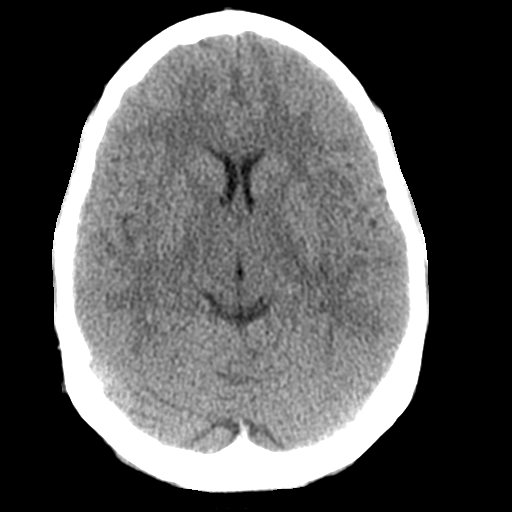
[im 14/29  brain]
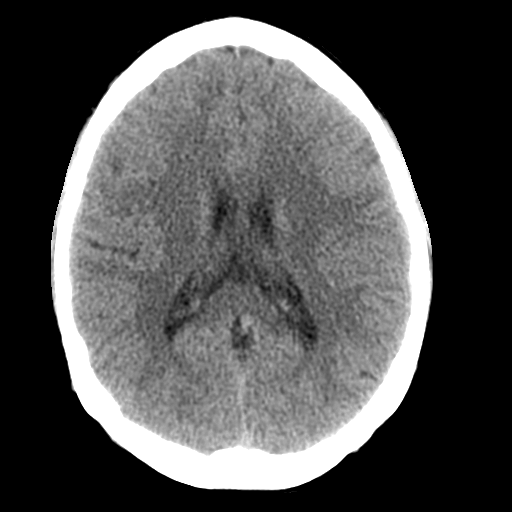
[im 14/29  bone]
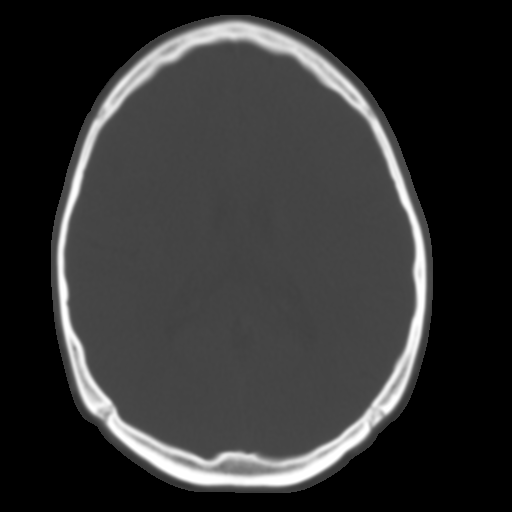
[im 16/29  brain]
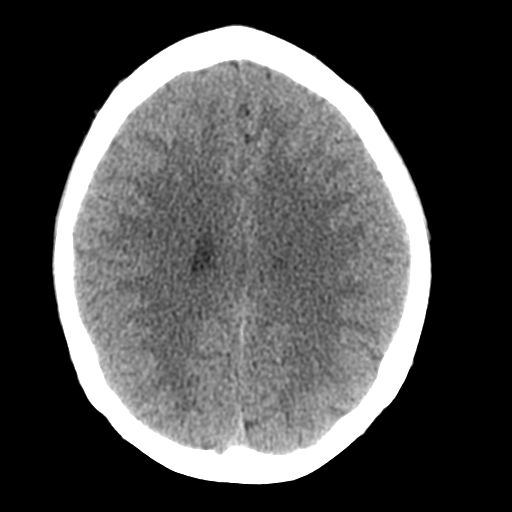
[im 19/29  brain]
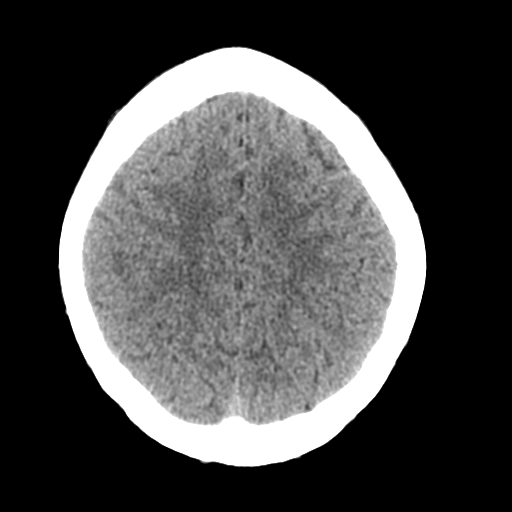
[im 22/29  brain]
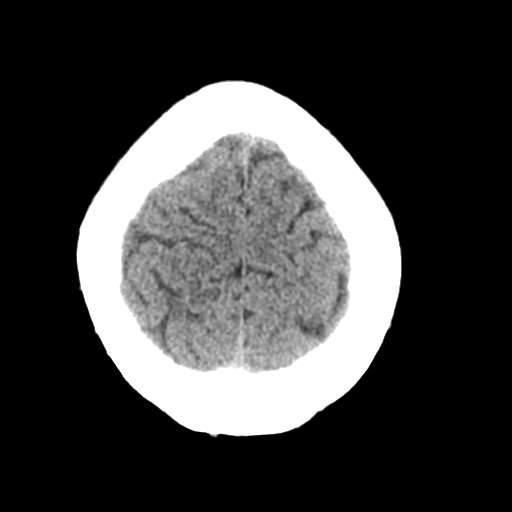
[im 24/29  brain]
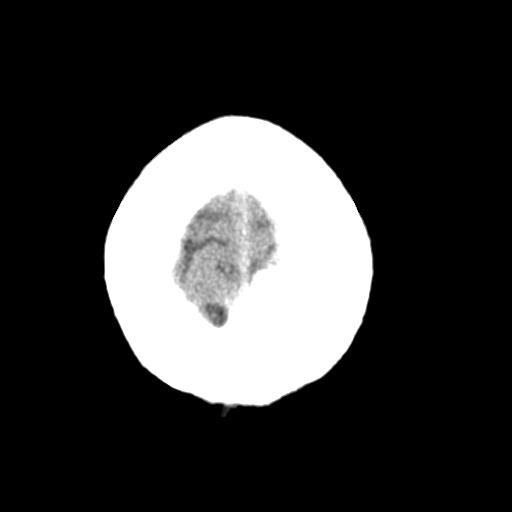
[im 24/29  bone]
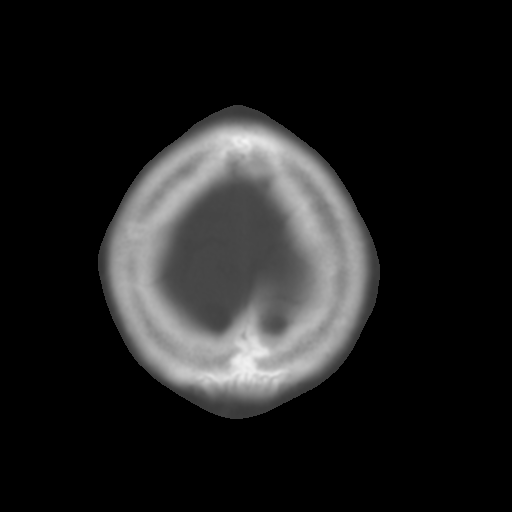
[im 27/29  brain]
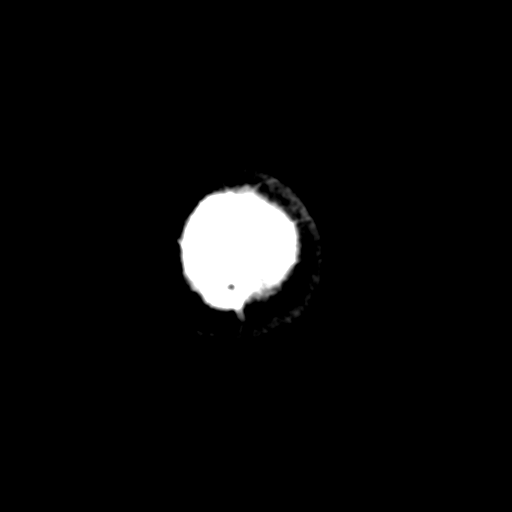

[Series 5: coronal soft tissue · coronal · 0.28mm/px · 3 of 62 slices shown]
[im 21/62  brain]
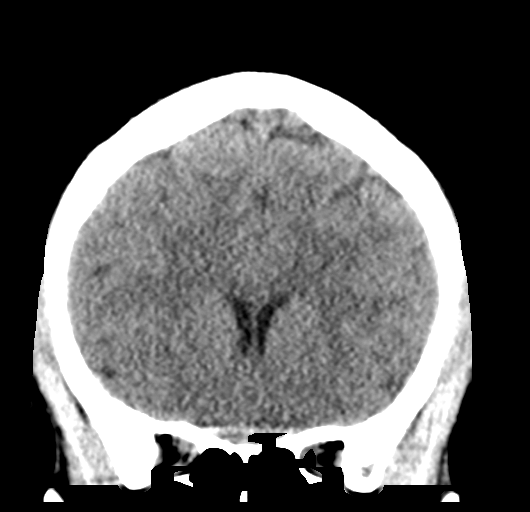
[im 28/62  brain]
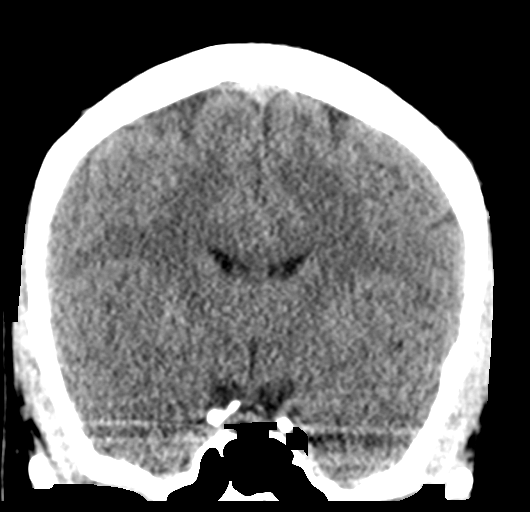
[im 34/62  brain]
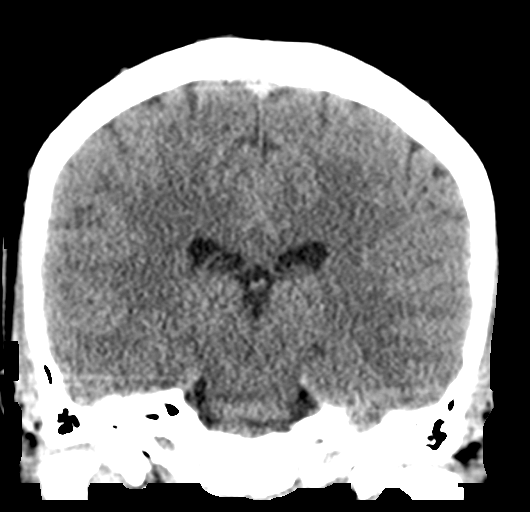

[Series 6: sagittal soft tissue · sagittal · 0.28mm/px · 3 of 50 slices shown]
[im 17/50  brain]
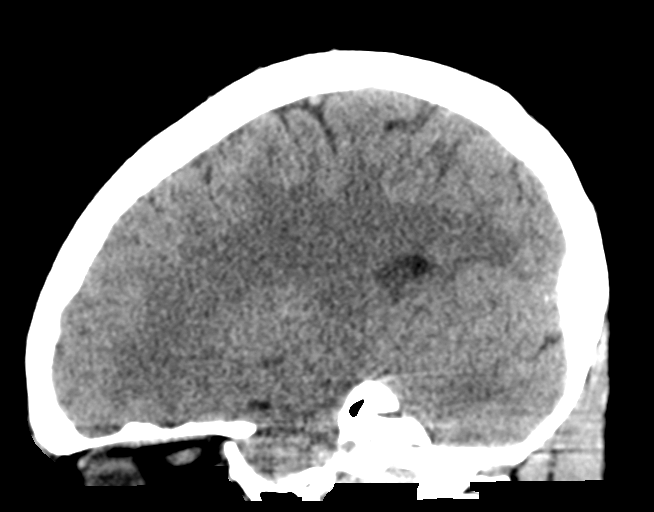
[im 25/50  brain]
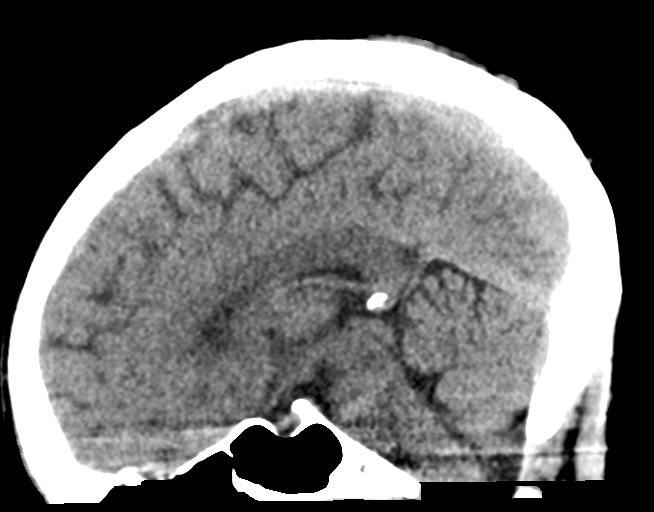
[im 33/50  brain]
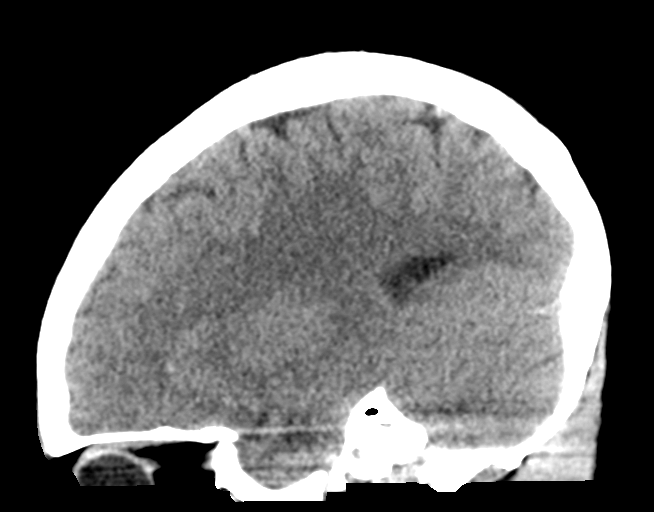

[16 of 46 positions shown; findings below may reference images not displayed]

FINDINGS: Brain: No acute infarct or intracranial hemorrhage. No mass lesion.
No midline shift, ventriculomegaly or extra-axial fluid collection.

Vascular: No hyperdense vessel or unexpected calcification.

Skull: Negative for fracture or focal lesion.

Sinuses/Orbits: Normal orbits. Clear paranasal sinuses. No mastoid
effusion.

Other: None.
IMPRESSION: No acute intracranial process.
